# Patient Record
Sex: Female | Born: 1950 | Race: White | Hispanic: No | Marital: Married | Smoking: Former smoker
Health system: Southern US, Community
[De-identification: ages and names within clinical notes are randomized; demographics above are authoritative.]

## PROBLEM LIST (undated history)

## (undated) DIAGNOSIS — Z87442 Personal history of urinary calculi: Secondary | ICD-10-CM

## (undated) DIAGNOSIS — E7800 Pure hypercholesterolemia, unspecified: Secondary | ICD-10-CM

## (undated) DIAGNOSIS — M1990 Unspecified osteoarthritis, unspecified site: Secondary | ICD-10-CM

## (undated) DIAGNOSIS — Z9289 Personal history of other medical treatment: Secondary | ICD-10-CM

## (undated) DIAGNOSIS — T8859XA Other complications of anesthesia, initial encounter: Secondary | ICD-10-CM

## (undated) DIAGNOSIS — R519 Headache, unspecified: Secondary | ICD-10-CM

## (undated) DIAGNOSIS — I82409 Acute embolism and thrombosis of unspecified deep veins of unspecified lower extremity: Secondary | ICD-10-CM

## (undated) DIAGNOSIS — I10 Essential (primary) hypertension: Secondary | ICD-10-CM

## (undated) DIAGNOSIS — J189 Pneumonia, unspecified organism: Secondary | ICD-10-CM

## (undated) DIAGNOSIS — R350 Frequency of micturition: Secondary | ICD-10-CM

## (undated) DIAGNOSIS — J40 Bronchitis, not specified as acute or chronic: Secondary | ICD-10-CM

## (undated) DIAGNOSIS — C801 Malignant (primary) neoplasm, unspecified: Secondary | ICD-10-CM

## (undated) DIAGNOSIS — G5600 Carpal tunnel syndrome, unspecified upper limb: Secondary | ICD-10-CM

## (undated) HISTORY — PX: KIDNEY SURGERY: SHX687

## (undated) HISTORY — PX: TUBAL LIGATION: SHX77

## (undated) HISTORY — PX: APPENDECTOMY: SHX54

---

## 2011-10-17 ENCOUNTER — Emergency Department: Payer: Self-pay | Admitting: Emergency Medicine

## 2011-10-17 LAB — URINALYSIS, COMPLETE
Blood: NEGATIVE
Glucose,UR: 50 mg/dL (ref 0–75)
Nitrite: NEGATIVE
Protein: 100
Specific Gravity: 1.014 (ref 1.003–1.030)
WBC UR: 1 /HPF (ref 0–5)

## 2011-10-17 LAB — CBC
HCT: 44.6 % (ref 35.0–47.0)
HGB: 14.5 g/dL (ref 12.0–16.0)
MCH: 29.4 pg (ref 26.0–34.0)
MCHC: 32.6 g/dL (ref 32.0–36.0)
MCV: 90 fL (ref 80–100)
Platelet: 241 10*3/uL (ref 150–440)

## 2011-10-17 LAB — BASIC METABOLIC PANEL
Anion Gap: 12 (ref 7–16)
BUN: 17 mg/dL (ref 7–18)
Co2: 27 mmol/L (ref 21–32)
Creatinine: 1.17 mg/dL (ref 0.60–1.30)
EGFR (Non-African Amer.): 51 — ABNORMAL LOW

## 2011-10-17 LAB — LIPASE, BLOOD: Lipase: 113 U/L (ref 73–393)

## 2011-11-18 ENCOUNTER — Ambulatory Visit: Payer: Self-pay | Admitting: Urology

## 2011-11-18 LAB — CREATININE, SERUM: EGFR (Non-African Amer.): >60

## 2011-12-19 ENCOUNTER — Other Ambulatory Visit (HOSPITAL_COMMUNITY): Payer: Self-pay | Admitting: Urology

## 2011-12-19 ENCOUNTER — Ambulatory Visit (HOSPITAL_COMMUNITY)
Admission: RE | Admit: 2011-12-19 | Discharge: 2011-12-19 | Disposition: A | Discharge status: Home / Self Care | Payer: BC Managed Care – PPO | Source: Ambulatory Visit | Attending: Urology | Admitting: Urology

## 2011-12-19 DIAGNOSIS — C649 Malignant neoplasm of unspecified kidney, except renal pelvis: Secondary | ICD-10-CM | POA: Insufficient documentation

## 2011-12-19 DIAGNOSIS — Z01811 Encounter for preprocedural respiratory examination: Secondary | ICD-10-CM | POA: Insufficient documentation

## 2011-12-19 DIAGNOSIS — I10 Essential (primary) hypertension: Secondary | ICD-10-CM | POA: Insufficient documentation

## 2011-12-19 DIAGNOSIS — D4959 Neoplasm of unspecified behavior of other genitourinary organ: Secondary | ICD-10-CM

## 2011-12-19 DIAGNOSIS — I517 Cardiomegaly: Secondary | ICD-10-CM | POA: Insufficient documentation

## 2011-12-22 ENCOUNTER — Other Ambulatory Visit: Payer: Self-pay | Admitting: Urology

## 2011-12-24 ENCOUNTER — Encounter (HOSPITAL_COMMUNITY): Payer: Self-pay | Admitting: Pharmacy Technician

## 2011-12-26 ENCOUNTER — Encounter (HOSPITAL_COMMUNITY): Payer: Self-pay

## 2011-12-26 ENCOUNTER — Encounter (HOSPITAL_COMMUNITY)
Admission: RE | Admit: 2011-12-26 | Discharge: 2011-12-26 | Disposition: A | Discharge status: Home / Self Care | Payer: BC Managed Care – PPO | Source: Ambulatory Visit | Attending: Urology | Admitting: Urology

## 2011-12-26 DIAGNOSIS — M1990 Unspecified osteoarthritis, unspecified site: Secondary | ICD-10-CM

## 2011-12-26 DIAGNOSIS — Z9289 Personal history of other medical treatment: Secondary | ICD-10-CM

## 2011-12-26 DIAGNOSIS — I82409 Acute embolism and thrombosis of unspecified deep veins of unspecified lower extremity: Secondary | ICD-10-CM

## 2011-12-26 DIAGNOSIS — R350 Frequency of micturition: Secondary | ICD-10-CM

## 2011-12-26 DIAGNOSIS — E7800 Pure hypercholesterolemia, unspecified: Secondary | ICD-10-CM

## 2011-12-26 DIAGNOSIS — J40 Bronchitis, not specified as acute or chronic: Secondary | ICD-10-CM

## 2011-12-26 DIAGNOSIS — G5600 Carpal tunnel syndrome, unspecified upper limb: Secondary | ICD-10-CM

## 2011-12-26 HISTORY — DX: Acute embolism and thrombosis of unspecified deep veins of unspecified lower extremity: I82.409

## 2011-12-26 HISTORY — DX: Unspecified osteoarthritis, unspecified site: M19.90

## 2011-12-26 HISTORY — PX: KNEE ARTHROSCOPY: SHX127

## 2011-12-26 HISTORY — DX: Frequency of micturition: R35.0

## 2011-12-26 HISTORY — DX: Carpal tunnel syndrome, unspecified upper limb: G56.00

## 2011-12-26 HISTORY — DX: Pure hypercholesterolemia, unspecified: E78.00

## 2011-12-26 HISTORY — PX: TONSILLECTOMY: SUR1361

## 2011-12-26 HISTORY — DX: Personal history of other medical treatment: Z92.89

## 2011-12-26 HISTORY — PX: CHOLECYSTECTOMY: SHX55

## 2011-12-26 HISTORY — PX: FOOT SURGERY: SHX648

## 2011-12-26 HISTORY — PX: JOINT REPLACEMENT: SHX530

## 2011-12-26 HISTORY — DX: Bronchitis, not specified as acute or chronic: J40

## 2011-12-26 HISTORY — PX: ECTOPIC PREGNANCY SURGERY: SHX613

## 2011-12-26 HISTORY — DX: Essential (primary) hypertension: I10

## 2011-12-26 HISTORY — PX: OTHER SURGICAL HISTORY: SHX169

## 2011-12-26 LAB — BASIC METABOLIC PANEL
BUN: 17 mg/dL (ref 6–23)
Chloride: 100 mEq/L (ref 96–112)
GFR calc Af Amer: >90 mL/min (ref >90)
Glucose, Bld: 236 mg/dL — ABNORMAL HIGH (ref 70–99)
Potassium: 3.8 mEq/L (ref 3.5–5.1)

## 2011-12-26 LAB — CBC
HCT: 40.6 % (ref 36.0–46.0)
Hemoglobin: 13.4 g/dL (ref 12.0–15.0)
MCHC: 33 g/dL (ref 30.0–36.0)
RBC: 4.57 MIL/uL (ref 3.87–5.11)

## 2011-12-26 LAB — SURGICAL PCR SCREEN: Staphylococcus aureus: NEGATIVE

## 2011-12-26 NOTE — Patient Instructions (Signed)
20 [name]  12/26/2011   Your procedure is scheduled on:  9-26 -2013  Report to [facility] at      0515  AM ..  Call this number if you have problems the morning of surgery: [phone]  Or Presurgical Testing [phone]([name])   Remember:   Do not eat food:After Midnight.  Follow any bowel prep instructions per Dr. Vevelyn Royals office.    Take these medicines the morning of surgery with A SIP OF WATER: Donot take any Diabetic meds or Blood pressure med Am of surgery. Bring nasal spray to hospital.   Do not wear jewelry, make-up or nail polish.  Do not wear lotions, powders, or perfumes. You may wear deodorant.  Do not shave 48 hours prior to surgery.(face and neck okay, no shaving of legs)  Do not bring valuables to the hospital.  Contacts, dentures or bridgework may not be worn into surgery.  Leave suitcase in the car. After surgery it may be brought to your room.  For patients admitted to the hospital, checkout time is 11:00 AM the day of discharge.   Patients discharged the day of surgery will not be allowed to drive home. Must have responsible person with you x 24 hours once discharged.  Name and phone number of your driver: spouse Grace [name] [phone]  Special Instructions: CHG Shower Use Special Wash: 1/2 bottle night before surgery and 1/2 bottle morning of surgery.(avoid face and genitals)-See special instruction sheet.   Please read over the following fact sheets that you were given: MRSA Information, Blood Transfusion fact sheet, Incentive Spirometry Instruction.

## 2011-12-31 NOTE — H&P (Signed)
Chief Complaint  Right renal neoplasm   Reason For Visit  Reason for consult: To discuss minimally invasive nephron sparing surgery for treatment of her right renal neoplasm. Physician requesting consult: Dr. Irineo Axon PCP: Dr. Luster Landsberg   History of Present Illness  Grace [name] is a 61 year old female with a history of urolithiasis who recently presented with a 4 mm right distal ureteral calculus to Dr. Lonna Cobb.  A CT stone study on 10/17/11 also incidentally demonstrated a possible mass on the upper pole of the right kidney.  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 She has denied any hematuria, weight loss, or abdominal pain symptoms. She has no family history of kidney cancer.  Baseline renal function: Pending  Her medical history is significant for a DVT when she was around 61 years old. This apparently occurred spontaneously and she was treated with anticoagulation. She has not developed recurrent thromboembolic disease and has not had any complications with subsequent surgeries including her bilateral knee replacements. She has undergone a prior cholecystectomy in 1970 through a large right upper quadrant incision. She also had a ruptured tubal pregnancy requiring exploration through a Pfannenstiel incision.   Past Medical History Problems  1. History of  Arthritis V13.4 2. History of  Diabetes Mellitus 250.00 3. Former Smoker V15.82 4. History of  Gastric Ulcer 531.90 5. History of  Hypercholesterolemia 272.0 6. History of  Hypertension 401.9 7. 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 History of  Cholecystectomy 2. History of  Knee Surgery Bilateral 3. History of  Ostectomy Calcaneus Left Foot 4. History of  Surgical Excision Of Tubal Pregnancy 5. History of  Tubal Ligation V25.2  Current Meds 1.  Acetaminophen-Codeine #3 300-30 MG Oral Tablet; Therapy: 28Mar2013 to 2. Aspirin 81 MG Oral Tablet; Therapy: (Recorded:13Sep2013) to 3. Fluticasone Propionate 50 MCG/ACT Nasal Suspension; Therapy: 17Dec2012 to 4. Furosemide 40 MG Oral Tablet; Therapy: 27Aug2012 to 5. GlipiZIDE XL 5 MG Oral Tablet Extended Release 24 Hour; Therapy: 18Mar2013 to 6. Lisinopril 20 MG Oral Tablet; Therapy: 04Apr2013 to 7. Meloxicam 15 MG Oral Tablet; Therapy: 04Apr2013 to 8. MetFORMIN HCl 1000 MG Oral Tablet; Therapy: 05Dec2012 to 9. Potassium Chloride Crys ER 10 MEQ Oral Tablet Extended Release; Therapy:  (Recorded:13Sep2013) to 10. Simvastatin 20 MG Oral Tablet; Therapy: 05Dec2012 to  Allergies Medication  1. Compazine SOLN  Family History Problems  1. Family history of  Arthritis V17.7 2. Paternal history of  Cancer 3. Maternal history of  Cancer 4. Maternal history of  Father Deceased At Age 68 5. Family history of  Heart Disease V17.49 6. Maternal history of  Mother Deceased At Age 66 Denied  80. Family history of  Kidney Cancer  Social History Problems    Marital History - Currently Married Denied    History of  Alcohol Use  Review of Systems Constitutional, skin, eye, otolaryngeal, hematologic/lymphatic, cardiovascular, pulmonary, endocrine, musculoskeletal, gastrointestinal, neurological and psychiatric system(s) were reviewed and pertinent findings if present are noted.  Gastrointestinal: diarrhea.  Constitutional: night sweats and feeling tired (fatigue).  ENT: sinus problems.  Cardiovascular: leg swelling.  Musculoskeletal: back pain and joint pain.    Vitals Vital Signs [Data Includes: Last 1 Day]  13Sep2013 11:02AM  Blood Pressure: 176 / 90 Temperature: 97.9 F Heart Rate: 72  Physical Exam Constitutional: Well nourished and  well developed . No acute distress.  ENT:. The ears and nose are normal in appearance.  Neck: The appearance of the neck is normal and no neck mass is  present.  Pulmonary: No respiratory distress, normal respiratory rhythm and effort and clear bilateral breath sounds.  Cardiovascular: Heart rate and rhythm are normal . No peripheral edema.  Abdomen: right upper quadrant, lower midline, Pfannensteil incision site(s) well healed. The abdomen is obese. The abdomen is soft and nontender. No masses are palpated. No CVA tenderness. No hernias are palpable. No hepatosplenomegaly noted.  Lymphatics: The supraclavicular, femoral and inguinal nodes are not enlarged or tender.  Skin: Normal skin turgor, no visible rash and no visible skin lesions.  Neuro/Psych:. Mood and affect are appropriate.    Results/Data Urine [Data Includes: Last 1 Day]   13Sep2013  COLOR YELLOW   APPEARANCE CLEAR   SPECIFIC GRAVITY 1.015   pH 5.5   GLUCOSE NEG mg/dL  BILIRUBIN NEG   KETONE NEG mg/dL  BLOOD NEG   PROTEIN NEG mg/dL  UROBILINOGEN 0.2 mg/dL  NITRITE NEG   LEUKOCYTE ESTERASE NEG     I independently reviewed her CT scan from [place]. This demonstrates an enhancing 3.2 cm neoplasm which is mostly exophytic extending off the posterior upper pole of the right kidney. There is no associated lymphadenopathy or renal vein/IVC involvement. There do not appear to be any adrenal tumors. There is no other evidence of metastatic disease to the abdomen.   Assessment Assessed  1. Renal Neoplasm 239.5  Plan Health Maintenance (V70.0)  1. UA With REFLEX  Done: 13Sep2013 10:27AM Renal Neoplasm (239.5)  2. CHEST X-RAY  Requested for: 13Sep2013 3. COMPREHENSIVE METABOLIC PANEL  Done: 13Sep2013 4. VENIPUNCTURE  Done: 13Sep2013 5. Follow-up Schedule Surgery Office  Follow-up  Done: 13Sep2013  Discussion/Summary  1. Right renal neoplasm concerning for renal malignancy: She will complete her metastatic evaluation including chest imaging and laboratory studies.   The patient was provided information regarding their renal mass including the relative risk of benign  versus malignant pathology and the natural history of renal cell carcinoma and other possible malignancies of the kidney. The role of renal biopsy, laboratory testing, and imaging studies to further characterize renal masses and/or the presence of metastatic disease were explained. We discussed the role of active surveillance, surgical therapy with both radical nephrectomy and nephron-sparing surgery, and ablative therapy in the treatment of renal masses. In addition, we discussed our goals of providing an accurate diagnosis and oncologic control while maintaining optimal renal function as appropriate based on the size, location, and complexity of their renal mass as well as their co-morbidities.    We have discussed the risks of treatment in detail including but not limited to bleeding, infection, heart attack, stroke, death, venothromoboembolism, cancer recurrence, injury/damage to surrounding organs and structures, urine leak, the possibility of open surgical conversion for patients undergoing minimally invasive surgery, the risk of developing chronic kidney disease and its associated implications, and the potential risk of end stage renal disease possibly necessitating dialysis.   After reviewing options, she does wish to proceed with surgical treatment. I have recommended nephron sparing surgery and will plan to attempt this in a minimally invasive fashion. However, considering her prior extensive surgical history, she understands the distinct risk for open surgical conversion which might be necessary. She will be tentatively scheduled for a right robotic-assisted laparoscopic partial nephrectomy versus possible open right partial nephrectomy. She will be administered a fluoroquinolone antibiotic in addition to routine  skin organism prophylaxis due to her prior total knee replacement and history of diabetes.  Cc: Dr. Irineo Axon Dr. Luster Landsberg    Verified Results COMPREHENSIVE METABOLIC PANEL1 13Sep2013  12:31PM1 Grace [name]  SPECIMEN TYPE: BLOOD  [Dec 20, 2011 7:59AM Grace [name]] Notify patient that labs are ok.   Test Name Result Flag Reference  GLUCOSE1 92 mg/dL1  70-991  BUN1 14 mg/dL1  6-231  CREATININE1 0.81 mg/dL1  0.50-1.501  SODIUM1 139 mEq/L1  [phone]  POTASSIUM1 4.2 mEq/L1  3.5-5.31  CHLORIDE1 103 mEq/L1  96-1121  CO21 29 mEq/L1  19-321  CALCIUM1 9.9 mg/dL1  8.4-10.51  TOTAL PROTEIN1 7.0 g/dL1  6.0-8.31  ALBUMIN1 4.7 g/dL1  3.5-5.21  AST/SGOT1 38 U/L1 H1 0-371  ALT/SGPT1 36 U/L1  0-531  ALKALINE PHOSPHATASE1 61 U/L1  39-1171  BILIRUBIN, TOTAL1 0.5 mg/dL1  0.3-1.21  Est GFR, African American1 >89 mL/min1    Est GFR, NonAfrican American1 >89 mL/min1    The estimated GFR is a calculation valid for adults (58 to 59 years old) that uses the CKD-EPI algorithm to adjust for age and sex. It is not to be used for children, pregnant women, hospitalized patients, patients on dialysis, or with rapidly changing kidney function. According to the NKDEP, eGFR >89 is normal, 60-89 shows mild impairment, 30-59 shows moderate impairment, 15-29 shows severe impairment and <15 is ESRD.     1. Amended By: Heloise Purpura; 12/20/2011 7:59 AMEST  SignaturesElectronically signed by : Heloise Purpura, M.D.; Dec 20 2011  7:59AM

## 2012-01-01 ENCOUNTER — Encounter (HOSPITAL_COMMUNITY): Payer: Self-pay | Admitting: Anesthesiology

## 2012-01-01 ENCOUNTER — Ambulatory Visit (HOSPITAL_COMMUNITY): Payer: BC Managed Care – PPO | Admitting: Anesthesiology

## 2012-01-01 ENCOUNTER — Inpatient Hospital Stay (HOSPITAL_COMMUNITY)
Admission: RE | Admit: 2012-01-01 | Discharge: 2012-01-03 | DRG: 303 | Disposition: A | Discharge status: Home / Self Care | Payer: BC Managed Care – PPO | Source: Ambulatory Visit | Attending: Urology | Admitting: Urology

## 2012-01-01 ENCOUNTER — Encounter (HOSPITAL_COMMUNITY)
Admission: RE | Disposition: A | Discharge status: Home / Self Care | Payer: Self-pay | Source: Ambulatory Visit | Attending: Urology

## 2012-01-01 ENCOUNTER — Encounter (HOSPITAL_COMMUNITY): Payer: Self-pay | Admitting: *Deleted

## 2012-01-01 ENCOUNTER — Other Ambulatory Visit: Payer: Self-pay

## 2012-01-01 DIAGNOSIS — Z87891 Personal history of nicotine dependence: Secondary | ICD-10-CM

## 2012-01-01 DIAGNOSIS — Z86718 Personal history of other venous thrombosis and embolism: Secondary | ICD-10-CM

## 2012-01-01 DIAGNOSIS — Z79899 Other long term (current) drug therapy: Secondary | ICD-10-CM

## 2012-01-01 DIAGNOSIS — Z7982 Long term (current) use of aspirin: Secondary | ICD-10-CM

## 2012-01-01 DIAGNOSIS — N200 Calculus of kidney: Secondary | ICD-10-CM | POA: Diagnosis present

## 2012-01-01 DIAGNOSIS — E119 Type 2 diabetes mellitus without complications: Secondary | ICD-10-CM | POA: Diagnosis present

## 2012-01-01 DIAGNOSIS — I10 Essential (primary) hypertension: Secondary | ICD-10-CM | POA: Diagnosis present

## 2012-01-01 DIAGNOSIS — C649 Malignant neoplasm of unspecified kidney, except renal pelvis: Principal | ICD-10-CM | POA: Diagnosis present

## 2012-01-01 DIAGNOSIS — K769 Liver disease, unspecified: Secondary | ICD-10-CM

## 2012-01-01 DIAGNOSIS — E7800 Pure hypercholesterolemia, unspecified: Secondary | ICD-10-CM | POA: Diagnosis present

## 2012-01-01 LAB — HEMOGLOBIN AND HEMATOCRIT, BLOOD
HCT: 39.7 % (ref 36.0–46.0)
Hemoglobin: 13.7 g/dL (ref 12.0–15.0)

## 2012-01-01 LAB — BASIC METABOLIC PANEL
CO2: 24 mEq/L (ref 19–32)
Chloride: 95 mEq/L — ABNORMAL LOW (ref 96–112)
GFR calc Af Amer: >90 mL/min (ref >90)
Sodium: 130 mEq/L — ABNORMAL LOW (ref 135–145)

## 2012-01-01 LAB — GLUCOSE, CAPILLARY
Glucose-Capillary: 120 mg/dL — ABNORMAL HIGH (ref 70–99)
Glucose-Capillary: 190 mg/dL — ABNORMAL HIGH (ref 70–99)
Glucose-Capillary: 135 mg/dL — ABNORMAL HIGH (ref 70–99)

## 2012-01-01 LAB — ABO/RH: ABO/RH(D): A POS

## 2012-01-01 LAB — TYPE AND SCREEN: Antibody Screen: NEGATIVE

## 2012-01-01 SURGERY — ROBOTIC ASSITED PARTIAL NEPHRECTOMY
Anesthesia: General | Site: Abdomen | Laterality: Right | Wound class: Clean Contaminated

## 2012-01-01 MED ORDER — SODIUM CHLORIDE 0.45 % IV SOLN
INTRAVENOUS | Status: DC
  Administered 2012-01-01 – 2012-01-03 (×5): via INTRAVENOUS

## 2012-01-01 MED ORDER — LACTATED RINGERS IV SOLN
INTRAVENOUS | Status: DC | PRN
  Administered 2012-01-01 (×3): via INTRAVENOUS

## 2012-01-01 MED ORDER — ONDANSETRON HCL 4 MG/2ML IJ SOLN
4.0000 mg | INTRAMUSCULAR | Status: DC | PRN
  Administered 2012-01-01: 4 mg via INTRAVENOUS
  Filled 2012-01-01: qty 2

## 2012-01-01 MED ORDER — HYDROMORPHONE HCL PF 1 MG/ML IJ SOLN
INTRAMUSCULAR | Status: AC
  Filled 2012-01-01: qty 1

## 2012-01-01 MED ORDER — BUPIVACAINE LIPOSOME 1.3 % IJ SUSP
20.0000 mL | Freq: Once | INTRAMUSCULAR | Status: AC
  Administered 2012-01-01: 20 mL
  Filled 2012-01-01: qty 20

## 2012-01-01 MED ORDER — CIPROFLOXACIN IN D5W 400 MG/200ML IV SOLN
INTRAVENOUS | Status: AC
  Filled 2012-01-01: qty 200

## 2012-01-01 MED ORDER — DIPHENHYDRAMINE HCL 12.5 MG/5ML PO ELIX: 12.5000 mg | ORAL_SOLUTION | Freq: Four times a day (QID) | ORAL | Status: DC | PRN

## 2012-01-01 MED ORDER — CIPROFLOXACIN IN D5W 400 MG/200ML IV SOLN
400.0000 mg | INTRAVENOUS | Status: AC
  Administered 2012-01-01: 400 mg via INTRAVENOUS

## 2012-01-01 MED ORDER — BUPIVACAINE-EPINEPHRINE PF 0.25-1:200000 % IJ SOLN
INTRAMUSCULAR | Status: AC
  Filled 2012-01-01: qty 30

## 2012-01-01 MED ORDER — MANNITOL 25 % IV SOLN
INTRAVENOUS | Status: DC | PRN
  Administered 2012-01-01 (×2): 12.5 g via INTRAVENOUS

## 2012-01-01 MED ORDER — FLUTICASONE PROPIONATE 50 MCG/ACT NA SUSP
2.0000 | Freq: Every day | NASAL | Status: DC
  Administered 2012-01-01 – 2012-01-03 (×3): 2 via NASAL
  Filled 2012-01-01: qty 16

## 2012-01-01 MED ORDER — MANNITOL 25 % IV SOLN
25.0000 g | Freq: Once | INTRAVENOUS | Status: DC
  Filled 2012-01-01: qty 100

## 2012-01-01 MED ORDER — GLYCOPYRROLATE 0.2 MG/ML IJ SOLN
INTRAMUSCULAR | Status: DC | PRN
  Administered 2012-01-01: .5 mg via INTRAVENOUS

## 2012-01-01 MED ORDER — INSULIN ASPART 100 UNIT/ML ~~LOC~~ SOLN
SUBCUTANEOUS | Status: AC
  Filled 2012-01-01: qty 1

## 2012-01-01 MED ORDER — PROPOFOL 10 MG/ML IV BOLUS
INTRAVENOUS | Status: DC | PRN
  Administered 2012-01-01: 150 mg via INTRAVENOUS

## 2012-01-01 MED ORDER — ONDANSETRON HCL 4 MG/2ML IJ SOLN
INTRAMUSCULAR | Status: DC | PRN
  Administered 2012-01-01: 4 mg via INTRAVENOUS

## 2012-01-01 MED ORDER — CEFAZOLIN SODIUM 1-5 GM-% IV SOLN
1.0000 g | Freq: Three times a day (TID) | INTRAVENOUS | Status: AC
  Administered 2012-01-01 (×2): 1 g via INTRAVENOUS
  Filled 2012-01-01 (×2): qty 50

## 2012-01-01 MED ORDER — ACETAMINOPHEN 10 MG/ML IV SOLN
1000.0000 mg | Freq: Four times a day (QID) | INTRAVENOUS | Status: AC
  Administered 2012-01-01 – 2012-01-02 (×4): 1000 mg via INTRAVENOUS
  Filled 2012-01-01 (×4): qty 100

## 2012-01-01 MED ORDER — LACTATED RINGERS IR SOLN
Status: DC | PRN
  Administered 2012-01-01: 1000 mL

## 2012-01-01 MED ORDER — SUCCINYLCHOLINE CHLORIDE 20 MG/ML IJ SOLN
INTRAMUSCULAR | Status: DC | PRN
  Administered 2012-01-01: 100 mg via INTRAVENOUS

## 2012-01-01 MED ORDER — ACETAMINOPHEN 10 MG/ML IV SOLN
INTRAVENOUS | Status: AC
  Filled 2012-01-01: qty 100

## 2012-01-01 MED ORDER — INSULIN ASPART 100 UNIT/ML ~~LOC~~ SOLN
0.0000 [IU] | SUBCUTANEOUS | Status: DC
  Administered 2012-01-01: 3 [IU] via SUBCUTANEOUS
  Administered 2012-01-01: 8 [IU] via SUBCUTANEOUS
  Administered 2012-01-01 (×2): 2 [IU] via SUBCUTANEOUS
  Administered 2012-01-02 (×3): 3 [IU] via SUBCUTANEOUS
  Administered 2012-01-02: 2 [IU] via SUBCUTANEOUS
  Administered 2012-01-02 – 2012-01-03 (×3): 3 [IU] via SUBCUTANEOUS
  Administered 2012-01-03 (×2): 2 [IU] via SUBCUTANEOUS

## 2012-01-01 MED ORDER — PROMETHAZINE HCL 25 MG/ML IJ SOLN: 6.2500 mg | INTRAMUSCULAR | Status: DC | PRN

## 2012-01-01 MED ORDER — MORPHINE SULFATE 2 MG/ML IJ SOLN
2.0000 mg | INTRAMUSCULAR | Status: DC | PRN
  Administered 2012-01-01 – 2012-01-02 (×3): 2 mg via INTRAVENOUS
  Administered 2012-01-02: 4 mg via INTRAVENOUS
  Filled 2012-01-01: qty 2
  Filled 2012-01-01 (×3): qty 1

## 2012-01-01 MED ORDER — SIMVASTATIN 20 MG PO TABS
20.0000 mg | ORAL_TABLET | Freq: Every day | ORAL | Status: DC
Start: 2012-01-01 — End: 2012-01-03
  Administered 2012-01-01 – 2012-01-02 (×2): 20 mg via ORAL
  Filled 2012-01-01 (×3): qty 1

## 2012-01-01 MED ORDER — HYDROMORPHONE HCL PF 1 MG/ML IJ SOLN
0.2500 mg | INTRAMUSCULAR | Status: DC | PRN
  Administered 2012-01-01 (×4): 0.5 mg via INTRAVENOUS

## 2012-01-01 MED ORDER — NEOSTIGMINE METHYLSULFATE 1 MG/ML IJ SOLN
INTRAMUSCULAR | Status: DC | PRN
  Administered 2012-01-01: 4 mg via INTRAVENOUS

## 2012-01-01 MED ORDER — ZOLPIDEM TARTRATE 5 MG PO TABS: 5.0000 mg | ORAL_TABLET | Freq: Every evening | ORAL | Status: DC | PRN

## 2012-01-01 MED ORDER — CISATRACURIUM BESYLATE (PF) 10 MG/5ML IV SOLN
INTRAVENOUS | Status: DC | PRN
  Administered 2012-01-01: 6 mg via INTRAVENOUS
  Administered 2012-01-01: 8 mg via INTRAVENOUS
  Administered 2012-01-01: 4 mg via INTRAVENOUS
  Administered 2012-01-01: 8 mg via INTRAVENOUS

## 2012-01-01 MED ORDER — BUPIVACAINE-EPINEPHRINE 0.25% -1:200000 IJ SOLN
INTRAMUSCULAR | Status: DC | PRN
  Administered 2012-01-01: 7 mL

## 2012-01-01 MED ORDER — HEMOSTATIC AGENTS (NO CHARGE) OPTIME
TOPICAL | Status: DC | PRN
Start: 2012-01-01 — End: 2012-01-01
  Administered 2012-01-01: 1 via TOPICAL

## 2012-01-01 MED ORDER — HYDROMORPHONE HCL PF 1 MG/ML IJ SOLN
0.2500 mg | INTRAMUSCULAR | Status: DC | PRN
  Administered 2012-01-01: 0.25 mg via INTRAVENOUS

## 2012-01-01 MED ORDER — CEFAZOLIN SODIUM-DEXTROSE 2-3 GM-% IV SOLR
INTRAVENOUS | Status: AC
  Filled 2012-01-01: qty 50

## 2012-01-01 MED ORDER — FUROSEMIDE 40 MG PO TABS
40.0000 mg | ORAL_TABLET | Freq: Every morning | ORAL | Status: DC
  Administered 2012-01-01 – 2012-01-03 (×3): 40 mg via ORAL
  Filled 2012-01-01 (×3): qty 1

## 2012-01-01 MED ORDER — CEFAZOLIN SODIUM 10 G IJ SOLR
3.0000 g | INTRAMUSCULAR | Status: AC
  Administered 2012-01-01: 3 g via INTRAVENOUS

## 2012-01-01 MED ORDER — DIPHENHYDRAMINE HCL 50 MG/ML IJ SOLN: 12.5000 mg | Freq: Four times a day (QID) | INTRAMUSCULAR | Status: DC | PRN

## 2012-01-01 MED ORDER — HYDRALAZINE HCL 20 MG/ML IJ SOLN
INTRAMUSCULAR | Status: DC | PRN
  Administered 2012-01-01 (×3): 5 mg via INTRAVENOUS

## 2012-01-01 MED ORDER — LISINOPRIL 40 MG PO TABS
60.0000 mg | ORAL_TABLET | Freq: Every day | ORAL | Status: DC
  Administered 2012-01-01 – 2012-01-03 (×3): 60 mg via ORAL
  Filled 2012-01-01 (×3): qty 1

## 2012-01-01 MED ORDER — DOCUSATE SODIUM 100 MG PO CAPS
100.0000 mg | ORAL_CAPSULE | Freq: Two times a day (BID) | ORAL | Status: DC
  Administered 2012-01-01 – 2012-01-03 (×5): 100 mg via ORAL
  Filled 2012-01-01 (×6): qty 1

## 2012-01-01 MED ORDER — LACTATED RINGERS IV SOLN: INTRAVENOUS | Status: DC

## 2012-01-01 MED ORDER — CEFAZOLIN SODIUM 1-5 GM-% IV SOLN
INTRAVENOUS | Status: AC
  Filled 2012-01-01: qty 50

## 2012-01-01 MED ORDER — MIDAZOLAM HCL 5 MG/5ML IJ SOLN
INTRAMUSCULAR | Status: DC | PRN
  Administered 2012-01-01: 2 mg via INTRAVENOUS

## 2012-01-01 MED ORDER — FENTANYL CITRATE 0.05 MG/ML IJ SOLN
INTRAMUSCULAR | Status: DC | PRN
  Administered 2012-01-01 (×7): 50 ug via INTRAVENOUS
  Administered 2012-01-01: 100 ug via INTRAVENOUS
  Administered 2012-01-01: 50 ug via INTRAVENOUS

## 2012-01-01 MED ORDER — ACETAMINOPHEN 10 MG/ML IV SOLN
INTRAVENOUS | Status: DC | PRN
  Administered 2012-01-01: 1000 mg via INTRAVENOUS

## 2012-01-01 MED ORDER — INDOCYANINE GREEN 25 MG IV SOLR
INTRAVENOUS | Status: DC | PRN
  Administered 2012-01-01: 7.5 mg via INTRAVENOUS

## 2012-01-01 SURGICAL SUPPLY — 65 items
APPLICATOR SURGIFLO ENDO (HEMOSTASIS) IMPLANT
CANNULA SEAL DVNC (CANNULA) ×3 IMPLANT
CANNULA SEALS DA VINCI (CANNULA) ×3
CHLORAPREP W/TINT 26ML (MISCELLANEOUS) ×4 IMPLANT
CLIP LIGATING HEM O LOK PURPLE (MISCELLANEOUS) ×2 IMPLANT
CLIP LIGATING HEMO O LOK GREEN (MISCELLANEOUS) ×4 IMPLANT
CLOTH BEACON ORANGE TIMEOUT ST (SAFETY) ×2 IMPLANT
CORD HIGH FREQUENCY UNIPOLAR (ELECTROSURGICAL) ×2 IMPLANT
CORDS BIPOLAR (ELECTRODE) ×2 IMPLANT
COVER SURGICAL LIGHT HANDLE (MISCELLANEOUS) ×2 IMPLANT
COVER TIP SHEARS 8 DVNC (MISCELLANEOUS) ×1 IMPLANT
COVER TIP SHEARS 8MM DA VINCI (MISCELLANEOUS) ×1
DECANTER SPIKE VIAL GLASS SM (MISCELLANEOUS) ×2 IMPLANT
DERMABOND ADVANCED (GAUZE/BANDAGES/DRESSINGS) ×3
DERMABOND ADVANCED .7 DNX12 (GAUZE/BANDAGES/DRESSINGS) ×3 IMPLANT
DRAIN CHANNEL 15F RND FF 3/16 (WOUND CARE) ×2 IMPLANT
DRAPE INCISE IOBAN 66X45 STRL (DRAPES) ×2 IMPLANT
DRAPE LAPAROSCOPIC ABDOMINAL (DRAPES) ×2 IMPLANT
DRAPE LG THREE QUARTER DISP (DRAPES) ×4 IMPLANT
DRAPE TABLE BACK 44X90 PK DISP (DRAPES) ×2 IMPLANT
DRAPE WARM FLUID 44X44 (DRAPE) ×2 IMPLANT
DRESSING SURGICEL FIBRLLR 1X2 (HEMOSTASIS) ×1 IMPLANT
DRSG SURGICEL FIBRILLAR 1X2 (HEMOSTASIS) ×2
ELECT REM PT RETURN 9FT ADLT (ELECTROSURGICAL) ×4
ELECTRODE REM PT RTRN 9FT ADLT (ELECTROSURGICAL) ×2 IMPLANT
EVACUATOR SILICONE 100CC (DRAIN) ×2 IMPLANT
GAUZE VASELINE 3X9 (GAUZE/BANDAGES/DRESSINGS) IMPLANT
GLOVE BIO SURGEON STRL SZ 6.5 (GLOVE) ×2 IMPLANT
GLOVE BIOGEL M STRL SZ7.5 (GLOVE) ×4 IMPLANT
GOWN STRL NON-REIN LRG LVL3 (GOWN DISPOSABLE) ×6 IMPLANT
GOWN STRL REIN XL XLG (GOWN DISPOSABLE) ×2 IMPLANT
HEMOSTAT SURGICEL 4X8 (HEMOSTASIS) ×2 IMPLANT
KIT ACCESSORY DA VINCI DISP (KITS) ×1
KIT ACCESSORY DVNC DISP (KITS) ×1 IMPLANT
KIT BASIN OR (CUSTOM PROCEDURE TRAY) ×2 IMPLANT
KIT PROCEDURE DA VINCI SI (MISCELLANEOUS) ×1
KIT PROCEDURE DVNC SI (MISCELLANEOUS) ×1 IMPLANT
NS IRRIG 1000ML POUR BTL (IV SOLUTION) ×4 IMPLANT
PENCIL BUTTON HOLSTER BLD 10FT (ELECTRODE) ×2 IMPLANT
POSITIONER SURGICAL ARM (MISCELLANEOUS) ×4 IMPLANT
POUCH SPECIMEN RETRIEVAL 10MM (ENDOMECHANICALS) ×2 IMPLANT
SET TUBE IRRIG SUCTION NO TIP (IRRIGATION / IRRIGATOR) ×2 IMPLANT
SOLUTION ANTI FOG 6CC (MISCELLANEOUS) ×2 IMPLANT
SOLUTION ELECTROLUBE (MISCELLANEOUS) ×2 IMPLANT
SPONGE LAP 18X18 X RAY DECT (DISPOSABLE) IMPLANT
SURGIFLO W/THROMBIN 8M KIT (HEMOSTASIS) ×2 IMPLANT
SUT ETHILON 3 0 PS 1 (SUTURE) ×2 IMPLANT
SUT MNCRL AB 4-0 PS2 18 (SUTURE) ×4 IMPLANT
SUT V-LOC BARB 180 2/0GR6 GS22 (SUTURE) ×4
SUT V-LOC BARB 180 2/0GR9 GS23 (SUTURE)
SUT VIC AB 0 CT1 27 (SUTURE) ×1
SUT VIC AB 0 CT1 27XBRD ANTBC (SUTURE) ×1 IMPLANT
SUT VICRYL 0 UR6 27IN ABS (SUTURE) ×4 IMPLANT
SUT VLOC BARB 180 ABS3/0GR12 (SUTURE) ×2
SUTURE V-LC BRB 180 2/0GR6GS22 (SUTURE) ×2 IMPLANT
SUTURE V-LC BRB 180 2/0GR9GS23 (SUTURE) IMPLANT
SUTURE VLOC BRB 180 ABS3/0GR12 (SUTURE) ×1 IMPLANT
SYR BULB IRRIGATION 50ML (SYRINGE) IMPLANT
TOWEL OR NON WOVEN STRL DISP B (DISPOSABLE) ×2 IMPLANT
TRAY FOLEY CATH 14FRSI W/METER (CATHETERS) ×2 IMPLANT
TRAY LAP CHOLE (CUSTOM PROCEDURE TRAY) ×2 IMPLANT
TROCAR ENDOPATH XCEL 12X100 BL (ENDOMECHANICALS) ×2 IMPLANT
TROCAR XCEL 12X100 BLDLESS (ENDOMECHANICALS) ×2 IMPLANT
TUBING INSUFFLATION 10FT LAP (TUBING) ×2 IMPLANT
WATER STERILE IRR 1500ML POUR (IV SOLUTION) ×4 IMPLANT

## 2012-01-01 NOTE — Transfer of Care (Signed)
Immediate Anesthesia Transfer of Care Note  Patient: Grace [name]  Procedure(s) Performed: Procedure(s) (LRB) with comments: ROBOTIC ASSITED PARTIAL NEPHRECTOMY (Right) - POSSIBLE OPEN NEPHRECTOMY      Patient Location: PACU  Anesthesia Type: General  Level of Consciousness: patient cooperative  Airway & Oxygen Therapy: Patient Spontanous Breathing and Patient connected to face mask oxygen  Post-op Assessment: Report given to PACU RN and Post -op Vital signs reviewed and stable  Post vital signs: Reviewed and stable  Complications: No apparent anesthesia complications

## 2012-01-01 NOTE — Preoperative (Signed)
Beta Blockers   Reason not to administer Beta Blockers:Not Applicable 

## 2012-01-01 NOTE — Anesthesia Preprocedure Evaluation (Signed)
Anesthesia Evaluation  Patient identified by MRN, date of birth, ID band Patient awake    Reviewed: Allergy & Precautions, H&P , NPO status , Patient's Chart, lab work & pertinent test results  History of Anesthesia Complications Negative for: history of anesthetic complications  Airway Mallampati: II TM Distance: >3 FB Neck ROM: Full    Dental  (+) Dental Advisory Given and Partial Upper   Pulmonary neg pulmonary ROS,  breath sounds clear to auscultation  Pulmonary exam normal       Cardiovascular hypertension, Rhythm:Regular Rate:Normal     Neuro/Psych  Neuromuscular disease negative neurological ROS  negative psych ROS   GI/Hepatic negative GI ROS, Neg liver ROS,   Endo/Other  diabetes, Type 2, Oral Hypoglycemic AgentsMorbid obesity  Renal/GU negative Renal ROS  negative genitourinary   Musculoskeletal negative musculoskeletal ROS (+)   Abdominal   Peds  Hematology negative hematology ROS (+)   Anesthesia Other Findings   Reproductive/Obstetrics negative OB ROS                           Anesthesia Physical Anesthesia Plan  ASA: III  Anesthesia Plan: General   Post-op Pain Management:    Induction: Intravenous  Airway Management Planned: Oral ETT  Additional Equipment:   Intra-op Plan:   Post-operative Plan: Extubation in OR  Informed Consent: I have reviewed the patients History and Physical, chart, labs and discussed the procedure including the risks, benefits and alternatives for the proposed anesthesia with the patient or authorized representative who has indicated his/her understanding and acceptance.   Dental advisory given  Plan Discussed with: CRNA  Anesthesia Plan Comments:         Anesthesia Quick Evaluation

## 2012-01-01 NOTE — Anesthesia Postprocedure Evaluation (Signed)
Anesthesia Post Note  Patient: Grace [name]  Procedure(s) Performed: Procedure(s) (LRB): ROBOTIC ASSITED PARTIAL NEPHRECTOMY (Right)  Anesthesia type: General  Patient location: PACU  Post pain: Pain level controlled  Post assessment: Post-op Vital signs reviewed  Last Vitals:  Filed Vitals:   01/01/12 1145  BP: 164/80  Pulse:   Temp:   Resp:     Post vital signs: Reviewed  Level of consciousness: sedated  Complications: No apparent anesthesia complications

## 2012-01-01 NOTE — Interval H&P Note (Signed)
History and Physical Interval Note:  01/01/2012 7:21 AM  Grace [name]  has presented today for surgery, with the diagnosis of RIGHT RENAL NEOPLASM  The various methods of treatment have been discussed with the patient and family. After consideration of risks, benefits and other options for treatment, the patient has consented to  Procedure(s) (LRB) with comments: ROBOTIC ASSITED PARTIAL NEPHRECTOMY (Right) - POSSIBLE OPEN NEPHRECTOMY     as a surgical intervention .  The patient's history has been reviewed, patient examined, no change in status, stable for surgery.  I have reviewed the patient's chart and labs.  Questions were answered to the patient's satisfaction.     [name],LES

## 2012-01-01 NOTE — Progress Notes (Signed)
Patient ID: Grace [name], female   DOB: 1950-07-10, 61 y.o.   MRN: [id number]  Post-op note  Subjective: The patient is doing well.  No complaints.  Objective: Vital signs in last 24 hours: Temp:  [97 F (36.1 C)-98.1 F (36.7 C)] 97.7 F (36.5 C) (09/26 1232) Pulse Rate:  [71-86] 86  (09/26 1232) Resp:  [13-20] 18  (09/26 1232) BP: (138-181)/(59-96) 138/59 mmHg (09/26 1232) SpO2:  [94 %-99 %] 94 % (09/26 1232) Weight:  [133.3 kg (293 lb 14 oz)] 133.3 kg (293 lb 14 oz) (09/26 1200)  Intake/Output from previous day:   Intake/Output this shift: Total I/O In: 2700 [I.V.:2700] Out: 760 [Urine:400; Drains:60; Blood:300]  Physical Exam:  General: Alert and oriented. Abdomen: Soft, Nondistended. Incisions: Clean and dry.  Lab Results:  Basename 01/01/12 1130  HGB 13.7  HCT 39.7    Assessment/Plan: POD#0   1) Continue to monitor   Moody Bruins. MD   LOS: 0 days   Sharnice Bosler,LES 01/01/2012, 5:37 PM

## 2012-01-01 NOTE — Op Note (Signed)
Preoperative diagnosis: Right renal neoplasm  Postoperative diagnosis: RIght renal neoplasm  Procedure:  1. Right robotic-assisted laparoscopic partial nephrectomy  Surgeon: Moody Bruins. M.D.  Assistant(s): Pecola Leisure, PA-C  Anesthesia: General  Complications: None  EBL: 300 mL  IVF:  2100 mL crystalloid  Specimens: 1. Right renal neoplasm  Disposition of specimens: Pathology  Intraoperative findings:       1. Segmental warm renal ischemia time: 20 minutes  Drains: 1. # 15 Blake perinephric drain  Indication:  Grace [name] is a 61 y.o. year old patient with a right renal neoplasm.  After a thorough review of the management options for their renal mass, they elected to proceed with surgical treatment and the above procedure.  We have discussed the potential benefits and risks of the procedure, side effects of the proposed treatment, the likelihood of the patient achieving the goals of the procedure, and any potential problems that might occur during the procedure or recuperation. Informed consent has been obtained.   Description of procedure:  The patient was taken to the operating room and a general anesthetic was administered. The patient was given preoperative antibiotics, placed in the right modified flank position with care to pad all potential pressure points, and prepped and draped in the usual sterile fashion. Next a preoperative timeout was performed.  A site was selected on the right side of the umbilicus for placement of the camera port. Due to her obesity, this site was somewhat lateral compared to usual placement.  It was well away from her prior right subcostal incision. This was placed using a standard open Hassan technique which allowed entry into the peritoneal cavity under direct vision and without difficulty. No adhesions were palpable around this incision site. A 12 mm port was placed and a pneumoperitoneum established. The camera was then  used to inspect the abdomen and there was no evidence of any intra-abdominal injuries. There were extensive adhesions in the right upper quadrant which were away from the areas that the other ports were to be placed. The remaining abdominal ports were then placed. 8 mm robotic ports were placed in the right upper quadrant, right lower quadrant, and far right lateral abdominal wall. A 12 mm port was placed in the upper midline for laparoscopic assistance. All ports were placed under direct vision without difficulty. The surgical cart was then docked.   Utilizing the cautery scissors, the adhesions were carefully taken down allowing the liver to be mobilized superiorly. The white line of Toldt was incised allowing the colon to be mobilized medially and the plane between the mesocolon and the anterior layer of Gerota's fascia to be developed and the kidney to be exposed.  The ureter and gonadal vein were identified inferiorly and the ureter was lifted anteriorly off the psoas muscle.  Dissection proceeded superiorly along the gonadal vein until the renal vein was identified.  The renal hilum was then carefully isolated with a combination of blunt and sharp dissection allowing the renal arterial and venous structures to be separated and isolated in preparation for renal hilar vessel clamping. There were two separate renal veins with a single branching renal artery posterior to the upper vein.  12.5 g of IV mannitol was then administered.   Attention turned to the kidney and the perinephric fat surrounding the renal mass was removed and the kidney was mobilized sufficiently for exposure and resection of the renal mass.   Once the renal mass was properly isolated, preparations were made for resection  of the tumor.  Reconstructive sutures were placed into the abdomen for the renorrhaphy portion of the procedure.  The superior pole renal artery was then clamped with bulldog clamps for segmental renal ischemia.  Indocyanine green dye was administered which confirmed ischemia around the upper pole area where the tumor was located.  The tumor was then excised with cold scissor dissection along with an adequate visible gross margin of normal renal parenchyma. The tumor appeared to be excised without any gross violation of the tumor. The renal collecting system was not entered during removal of the tumor.  A running 3-0 V-lock suture was then brought through the capsule of the kidney and run along the base of the renal defect to provide hemostasis and close any entry into the renal collecting system if present. Weck clips were used to secure this suture outside the renal capsule at the proximal and distal ends. Floseal was then placed into the renal defect and the renal parenchyma was closed with a 2-0 V-lock horizontal mattress capsular suture which resulted in excellent compression of the renal defect.    The bulldog clamps were then removed from the upper pole renal artery and an additional 12.5 g of IV mannitol was administered. Total segmental warm renal ischemia time was 20 minutes. The renal tumor resection site was examined. Hemostasis appeared adequate.   The kidney was placed back into its normal anatomic position and covered with perinephric fat as needed.  A # 15 Blake drain was then brought through the lateral lower port site and positioned in the perinephric space.  It was secured to the skin with a nylon suture. The surgical cart was undocked.  The renal tumor specimen was removed intact within an endopouch retrieval bag via the camera port sites.  The camera port site and the other 12 mm port site were then closed at the fascial level with 0-vicryl suture.  All other laparoscopic/robotic ports were removed under direct vision and the pneumoperitoneum let down with inspection of the operative field performed and hemostasis again confirmed. All incision sites were then injected with local anesthetic and  reapproximated at the skin level with 4-0 monocryl subcuticular closures.  Dermabond was applied to the skin.  The patient tolerated the procedure well and without complications.  The patient was able to be extubated and transferred to the recovery unit in satisfactory condition.  Moody Bruins MD

## 2012-01-02 LAB — GLUCOSE, CAPILLARY
Glucose-Capillary: 173 mg/dL — ABNORMAL HIGH (ref 70–99)
Glucose-Capillary: 162 mg/dL — ABNORMAL HIGH (ref 70–99)
Glucose-Capillary: 186 mg/dL — ABNORMAL HIGH (ref 70–99)

## 2012-01-02 LAB — BASIC METABOLIC PANEL
CO2: 29 mEq/L (ref 19–32)
Chloride: 98 mEq/L (ref 96–112)
GFR calc non Af Amer: 60 mL/min — ABNORMAL LOW (ref >90)
Glucose, Bld: 148 mg/dL — ABNORMAL HIGH (ref 70–99)
Potassium: 4 mEq/L (ref 3.5–5.1)
Sodium: 136 mEq/L (ref 135–145)

## 2012-01-02 LAB — HEMOGLOBIN AND HEMATOCRIT, BLOOD: HCT: 37.6 % (ref 36.0–46.0)

## 2012-01-02 MED ORDER — HYDROCODONE-ACETAMINOPHEN 5-325 MG PO TABS: 1.0000 | ORAL_TABLET | Freq: Four times a day (QID) | ORAL | Status: DC | PRN

## 2012-01-02 MED ORDER — BISACODYL 10 MG RE SUPP
10.0000 mg | Freq: Once | RECTAL | Status: AC
  Administered 2012-01-02: 10 mg via RECTAL
  Filled 2012-01-02: qty 1

## 2012-01-02 MED ORDER — DOCUSATE SODIUM 100 MG PO CAPS: 100.0000 mg | ORAL_CAPSULE | Freq: Two times a day (BID) | ORAL | Status: DC

## 2012-01-02 MED ORDER — DSS 100 MG PO CAPS: 100.0000 mg | ORAL_CAPSULE | Freq: Two times a day (BID) | ORAL | Status: DC

## 2012-01-02 MED ORDER — HYDROCODONE-ACETAMINOPHEN 5-325 MG PO TABS
1.0000 | ORAL_TABLET | Freq: Four times a day (QID) | ORAL | Status: DC | PRN
  Administered 2012-01-02 – 2012-01-03 (×5): 2 via ORAL
  Filled 2012-01-02 (×5): qty 2

## 2012-01-02 NOTE — Progress Notes (Signed)
   CARE MANAGEMENT NOTE 01/02/2012  Patient:  Grace [name], Grace [name]   Account Number:  [id number]  Date Initiated:  01/02/2012  Documentation initiated by:  Jiles Crocker  Subjective/Objective Assessment:   ADMITTED FOR SURGERY Right robotic-assisted laparoscopic partial nephrectomy     Action/Plan:   PCP: Dr. Allison Quarry AT HOME WITH SPOUSE   Anticipated DC Date:  01/03/2012   Anticipated DC Plan:  HOME/SELF CARE      DC Planning Services  CM consult          Status of service:  In process, will continue to follow Medicare Important Message given?  NA - LOS <3 / Initial given by admissions (If response is "NO", the following Medicare IM given date fields will be blank)  Per UR Regulation:  Reviewed for med. necessity/level of care/duration of stay  Comments:  01/02/2012- B Lavaya Defreitas RN, BSN, MHA

## 2012-01-02 NOTE — Progress Notes (Addendum)
Patient ambulated in hallway on room air. When patient returned to room the O2 Sats were 88-91%. The patient was placed back on oxygen for now at 2 L nasal canula.

## 2012-01-02 NOTE — Progress Notes (Signed)
Patient ID: Grace [name], female   DOB: May 08, 1950, 61 y.o.   MRN: [id number]  1 Day Post-Op Subjective: The patient is doing well.  No nausea or vomiting. Pain at incision sites but adequately controlled.  Objective: Vital signs in last 24 hours: Temp:  [97 F (36.1 C)-99.1 F (37.3 C)] 98.2 F (36.8 C) (09/27 0551) Pulse Rate:  [71-86] 81  (09/27 0551) Resp:  [13-18] 18  (09/27 0551) BP: (125-171)/(55-88) 133/55 mmHg (09/27 0551) SpO2:  [94 %-99 %] 96 % (09/27 0551) Weight:  [133.3 kg (293 lb 14 oz)] 133.3 kg (293 lb 14 oz) (09/26 1200)  Intake/Output from previous day: 09/26 0701 - 09/27 0700 In: 5750 [I.V.:5450; IV Piggyback:300] Out: 1985 [Urine:1500; Drains:185; Blood:300] Intake/Output this shift:    Physical Exam:  General: Alert and oriented. CV: RRR Lungs: Clear bilaterally. GI: Soft, Nondistended. Incisions: Clean and dry. Urine: Clear Extremities: Nontender, no erythema, no edema.  Lab Results:  Basename 01/02/12 0458 01/01/12 1130  HGB 12.4 13.7  HCT 37.6 39.7          Basename 01/02/12 0458 01/01/12 1130 12/26/11 1355  CREATININE 1.00 0.76 0.76           Results for orders placed during the hospital encounter of 01/01/12 (from the past 24 hour(s))  GLUCOSE, CAPILLARY     Status: Abnormal   Collection Time   01/01/12 11:12 AM      Component Value Range   Glucose-Capillary 293 (*) 70 - 99 mg/dL  BASIC METABOLIC PANEL     Status: Abnormal   Collection Time   01/01/12 11:30 AM      Component Value Range   Sodium 130 (*) 135 - 145 mEq/L   Potassium 3.9  3.5 - 5.1 mEq/L   Chloride 95 (*) 96 - 112 mEq/L   CO2 24  19 - 32 mEq/L   Glucose, Bld 287 (*) 70 - 99 mg/dL   BUN 13  6 - 23 mg/dL   Creatinine, Ser 4.09  0.50 - 1.10 mg/dL   Calcium 8.7  8.4 - 81.1 mg/dL   GFR calc non Af Amer >90  >90 mL/min   GFR calc Af Amer >90  >90 mL/min  HEMOGLOBIN AND HEMATOCRIT, BLOOD     Status: Normal   Collection Time   01/01/12 11:30 AM      Component Value  Range   Hemoglobin 13.7  12.0 - 15.0 g/dL   HCT 91.4  78.2 - 95.6 %  GLUCOSE, CAPILLARY     Status: Abnormal   Collection Time   01/01/12  4:50 PM      Component Value Range   Glucose-Capillary 190 (*) 70 - 99 mg/dL  GLUCOSE, CAPILLARY     Status: Abnormal   Collection Time   01/01/12  8:00 PM      Component Value Range   Glucose-Capillary 135 (*) 70 - 99 mg/dL  GLUCOSE, CAPILLARY     Status: Abnormal   Collection Time   01/01/12 11:54 PM      Component Value Range   Glucose-Capillary 128 (*) 70 - 99 mg/dL   Comment 1 Notify RN    GLUCOSE, CAPILLARY     Status: Abnormal   Collection Time   01/02/12  4:01 AM      Component Value Range   Glucose-Capillary 139 (*) 70 - 99 mg/dL  BASIC METABOLIC PANEL     Status: Abnormal   Collection Time   01/02/12  4:58 AM  Component Value Range   Sodium 136  135 - 145 mEq/L   Potassium 4.0  3.5 - 5.1 mEq/L   Chloride 98  96 - 112 mEq/L   CO2 29  19 - 32 mEq/L   Glucose, Bld 148 (*) 70 - 99 mg/dL   BUN 11  6 - 23 mg/dL   Creatinine, Ser 6.29  0.50 - 1.10 mg/dL   Calcium 8.9  8.4 - 52.8 mg/dL   GFR calc non Af Amer 60 (*) >90 mL/min   GFR calc Af Amer 70 (*) >90 mL/min  HEMOGLOBIN AND HEMATOCRIT, BLOOD     Status: Normal   Collection Time   01/02/12  4:58 AM      Component Value Range   Hemoglobin 12.4  12.0 - 15.0 g/dL   HCT 41.3  24.4 - 01.0 %    Assessment/Plan: POD# 1 s/p robotic partial nephrectomy.  1) Ambulate, Incentive spirometry 2) Advance diet as tolerated 3) Transition to oral pain medication 4) Dulcolax suppository 5) D/C urethral catheter   Moody Bruins. MD   LOS: 1 day   Issaiah Seabrooks,LES 01/02/2012, 7:18 AM

## 2012-01-03 LAB — BASIC METABOLIC PANEL
CO2: 32 mEq/L (ref 19–32)
Calcium: 8.7 mg/dL (ref 8.4–10.5)
Creatinine, Ser: 0.84 mg/dL (ref 0.50–1.10)
GFR calc non Af Amer: 75 mL/min — ABNORMAL LOW (ref >90)
Glucose, Bld: 180 mg/dL — ABNORMAL HIGH (ref 70–99)

## 2012-01-03 LAB — HEMOGLOBIN AND HEMATOCRIT, BLOOD: HCT: 36.4 % (ref 36.0–46.0)

## 2012-01-03 LAB — CREATININE, FLUID (PLEURAL, PERITONEAL, JP DRAINAGE): Creat, Fluid: 0.9 mg/dL

## 2012-01-03 LAB — GLUCOSE, CAPILLARY: Glucose-Capillary: 170 mg/dL — ABNORMAL HIGH (ref 70–99)

## 2012-01-03 MED ORDER — OXYCODONE-ACETAMINOPHEN 5-325 MG PO TABS: 1.0000 | ORAL_TABLET | Freq: Four times a day (QID) | ORAL | Status: DC | PRN

## 2012-01-03 MED ORDER — DOCUSATE SODIUM 100 MG PO CAPS: 100.0000 mg | ORAL_CAPSULE | Freq: Two times a day (BID) | ORAL | Status: DC

## 2012-01-03 NOTE — Progress Notes (Signed)
Patient ID: Grace [name], female   DOB: Jul 30, 1950, 61 y.o.   MRN: [id number]  2 Days Post-Op Subjective: Pt doing well.  She had a BM last night and is passing flatus.  Tolerating regular diet. Pain is controlled with po pain meds.  Objective: Vital signs in last 24 hours: Temp:  [98.2 F (36.8 C)-99.8 F (37.7 C)] 98.2 F (36.8 C) (09/28 0443) Pulse Rate:  [75-89] 75  (09/28 0443) Resp:  [16-18] 16  (09/28 0443) BP: (120-150)/(54-95) 120/95 mmHg (09/28 0443) SpO2:  [96 %-100 %] 97 % (09/28 0443)  Intake/Output from previous day: 09/27 0701 - 09/28 0700 In: 2413.8 [P.O.:480; I.V.:1933.8] Out: 3395 [Urine:3300; Drains:95] Intake/Output this shift: Total I/O In: -  Out: 300 [Urine:300]  Physical Exam:  General: Alert and oriented CV: RRR Lungs: Clear Abdomen: Soft, ND, Positive bowel sounds Incisions: C/D/I Ext: NT, No erythema  Lab Results:  Basename 01/03/12 0519 01/02/12 0458 01/01/12 1130  HGB 11.9* 12.4 13.7  HCT 36.4 37.6 39.7   BMET  Basename 01/03/12 0519 01/02/12 0458  NA 135 136  K 4.1 4.0  CL 97 98  CO2 32 29  GLUCOSE 180* 148*  BUN 8 11  CREATININE 0.84 1.00  CALCIUM 8.7 8.9     Studies/Results: No results found.  Pathology pending  Assessment/Plan: - Check JP creatinine - Saline lock IV - Ambulate, IS - Probable D/C home later today   LOS: 2 days   Kiptyn Rafuse,LES 01/03/2012, 8:06 AM

## 2012-01-03 NOTE — Discharge Summary (Signed)
Date of admission: 01/01/2012  Date of discharge: 01/03/2012  Admission diagnosis: Right renal neoplasm   Discharge diagnosis: Right renal neoplasm  Secondary diagnoses: Diabetes  History and Physical: For full details, please see admission history and physical. Briefly, Grace [name] is a 61 y.o. year old patient with a right renal neoplasm concerning for malignancy.  Her metastatic evaluation was negative.   Hospital Course: She underwent a robotic assisted laparoscopic right partial nephrectomy on 01/01/12. She remained hemodynamically stable after a period of bedrest for about 24 hrs.  She began ambulating on POD #1 and her diet was advanced.  She was transitioned to oral pain medication.  Her perinephric drain had minimal output and on POD #2 it was checked for a creatinine level and was consistent was serum and was therefore removed.  She was felt to be stable for discharge on the afternoon of POD #2. Her pathology is pending at the time of discharge.  Laboratory values:  Basename 01/03/12 0519 01/02/12 0458 01/01/12 1130  HGB 11.9* 12.4 13.7  HCT 36.4 37.6 39.7    Basename 01/03/12 0519 01/02/12 0458  CREATININE 0.84 1.00    Disposition: Home  Discharge instruction: The patient was instructed to be ambulatory but told to refrain from heavy lifting, strenuous activity, or driving.   Discharge medications:    Medication List     As of 01/03/2012  2:59 PM    START taking these medications         docusate sodium 100 MG capsule   Commonly known as: COLACE   Take 1 capsule (100 mg total) by mouth 2 (two) times daily.      oxyCODONE-acetaminophen 5-325 MG per tablet   Commonly known as: PERCOCET/ROXICET   Take 1-2 tablets by mouth every 6 (six) hours as needed.      CONTINUE taking these medications         acetaminophen 500 MG tablet   Commonly known as: TYLENOL      diphenhydrAMINE 25 mg capsule   Commonly known as: BENADRYL      fluticasone 50 MCG/ACT nasal  spray   Commonly known as: FLONASE      furosemide 40 MG tablet   Commonly known as: LASIX      glipiZIDE 5 MG 24 hr tablet   Commonly known as: GLUCOTROL XL      lisinopril 20 MG tablet   Commonly known as: PRINIVIL,ZESTRIL      meloxicam 15 MG tablet   Commonly known as: MOBIC      metFORMIN 1000 MG tablet   Commonly known as: GLUCOPHAGE      potassium chloride SA 20 MEQ tablet   Commonly known as: K-DUR,KLOR-CON      simvastatin 20 MG tablet   Commonly known as: ZOCOR      STOP taking these medications         aspirin EC 81 MG tablet      loperamide 2 MG capsule   Commonly known as: IMODIUM      multivitamin with minerals Tabs          Where to get your medications    These are the prescriptions that you need to pick up.   You may get these medications from any pharmacy.         docusate sodium 100 MG capsule   oxyCODONE-acetaminophen 5-325 MG per tablet            Followup:  Follow-up Information    Follow  up with Crecencio Mc, MD. On 01/27/2012. (at 10:30)    Contact information:   [address] [place] [address] [phone]

## 2012-01-03 NOTE — Progress Notes (Signed)
Patient discharge to home, alert and oriented, no distress, incision site clean and intact no s/s of infection noted. PIV removed no s/s of infiltration or swelling noted.D/C instructions done and given to the patient.

## 2012-02-20 NOTE — Progress Notes (Signed)
This is to note that this patient was diagnosed with renal cell carcinoma.

## 2012-08-04 ENCOUNTER — Ambulatory Visit (HOSPITAL_COMMUNITY)
Admission: RE | Admit: 2012-08-04 | Discharge: 2012-08-04 | Disposition: A | Discharge status: Home / Self Care | Payer: BC Managed Care – PPO | Source: Ambulatory Visit | Attending: Urology | Admitting: Urology

## 2012-08-04 ENCOUNTER — Other Ambulatory Visit (HOSPITAL_COMMUNITY): Payer: Self-pay | Admitting: Urology

## 2012-08-04 DIAGNOSIS — E119 Type 2 diabetes mellitus without complications: Secondary | ICD-10-CM | POA: Insufficient documentation

## 2012-08-04 DIAGNOSIS — C649 Malignant neoplasm of unspecified kidney, except renal pelvis: Secondary | ICD-10-CM

## 2012-08-04 DIAGNOSIS — I10 Essential (primary) hypertension: Secondary | ICD-10-CM | POA: Insufficient documentation

## 2013-02-18 ENCOUNTER — Ambulatory Visit (HOSPITAL_COMMUNITY)
Admission: RE | Admit: 2013-02-18 | Discharge: 2013-02-18 | Disposition: A | Discharge status: Home / Self Care | Payer: BC Managed Care – PPO | Source: Ambulatory Visit | Attending: Urology | Admitting: Urology

## 2013-02-18 ENCOUNTER — Other Ambulatory Visit (HOSPITAL_COMMUNITY): Payer: Self-pay | Admitting: Urology

## 2013-02-18 DIAGNOSIS — C649 Malignant neoplasm of unspecified kidney, except renal pelvis: Secondary | ICD-10-CM

## 2013-02-18 DIAGNOSIS — Z85528 Personal history of other malignant neoplasm of kidney: Secondary | ICD-10-CM | POA: Insufficient documentation

## 2013-04-07 HISTORY — PX: HERNIA REPAIR: SHX51

## 2013-07-13 IMAGING — CT CT ABD-PELV W/O CM
1 of 2 series · 15 of 32 positions shown, 19 images · non-contrast
Comparison: none

REASON FOR EXAM: (1) right flank pain; (2) right flank pain
COMMENTS:   May transport without cardiac monitor

PROCEDURE:     CT  - CT ABDOMEN AND PELVIS W[DATE]  [DATE]
RESULT:
TECHNIQUE: Helical noncontrast 3 mm sections were obtained from the lung
bases through the pubic symphysis.

[Series 2: 3mm soft tissue · axial · 0.86mm/px · z∈[-414,+36]mm · 15 of 166 slices shown, 19 images]
[im 8/166  soft-tissue]
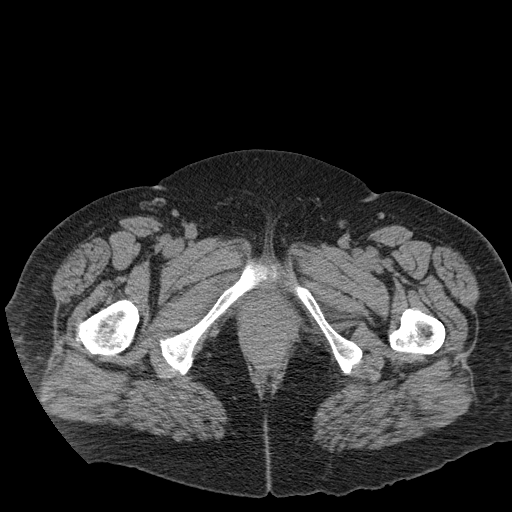
[im 8/166  bone]
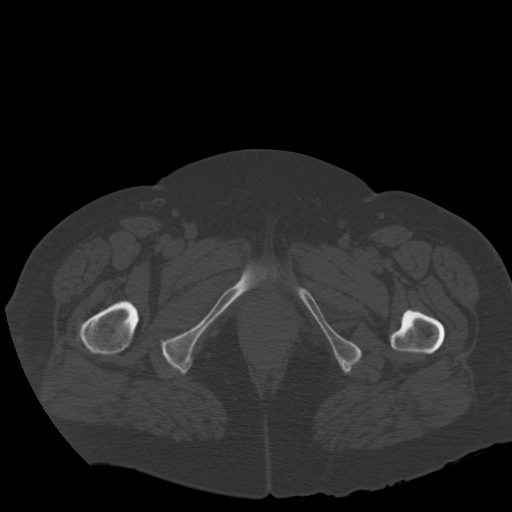
[im 22/166  soft-tissue]
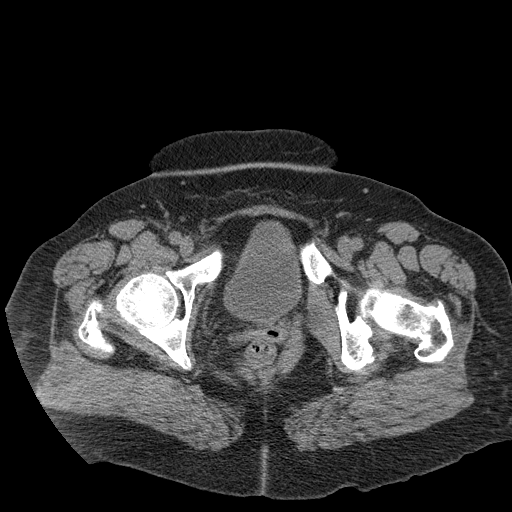
[im 36/166  soft-tissue]
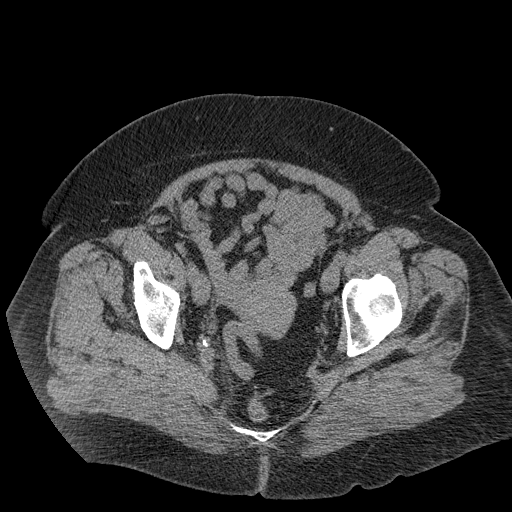
[im 44/166  soft-tissue]
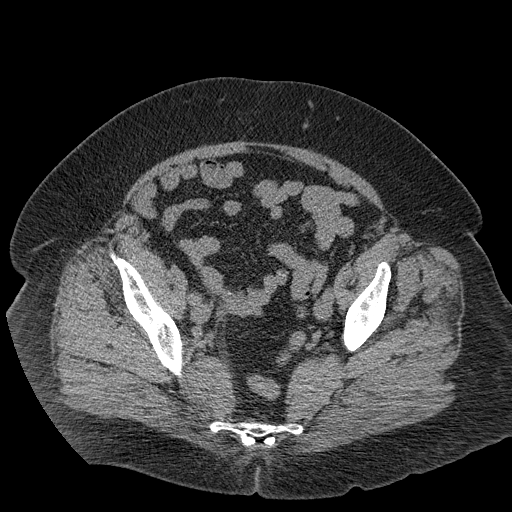
[im 58/166  soft-tissue]
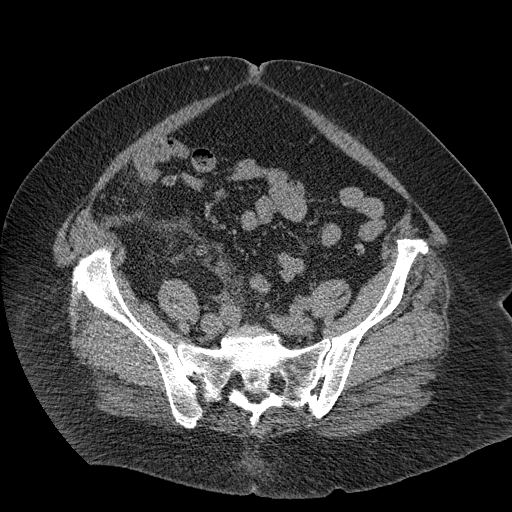
[im 72/166  soft-tissue]
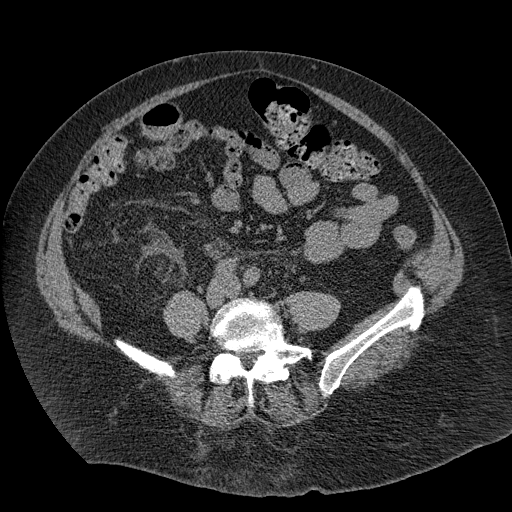
[im 87/166  soft-tissue]
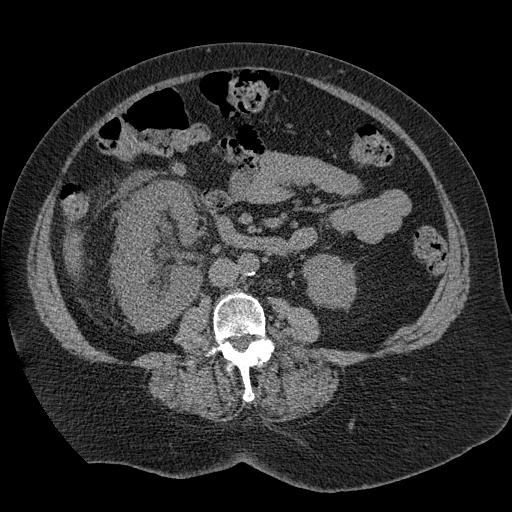
[im 94/166  soft-tissue]
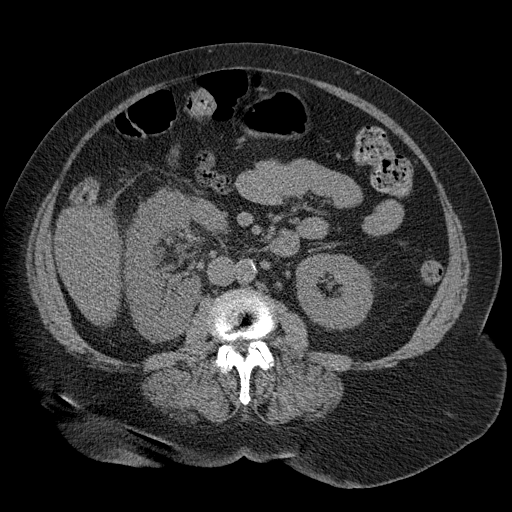
[im 108/166  soft-tissue]
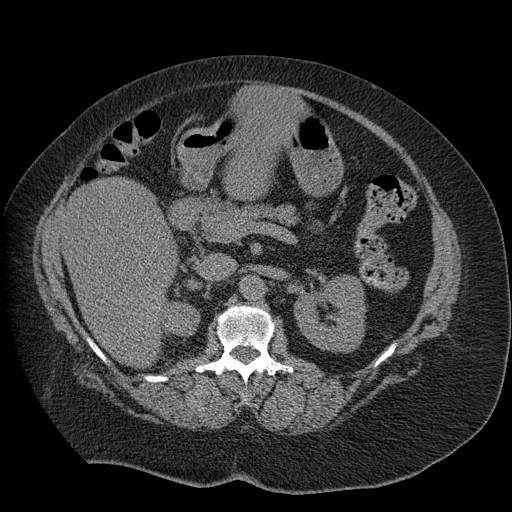
[im 108/166  bone]
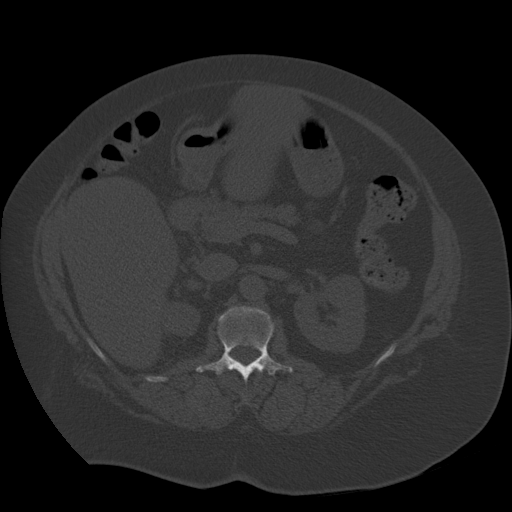
[im 122/166  soft-tissue]
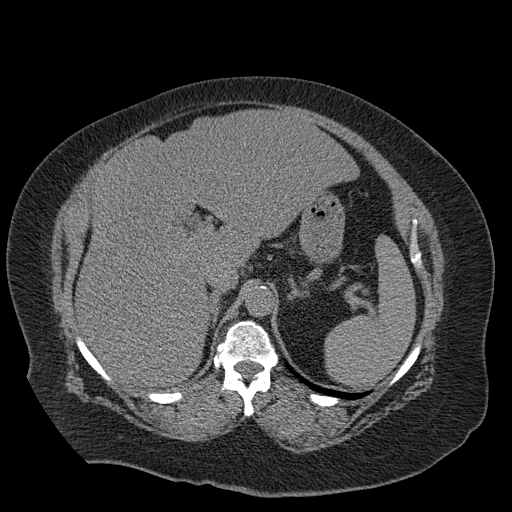
[im 130/166  soft-tissue]
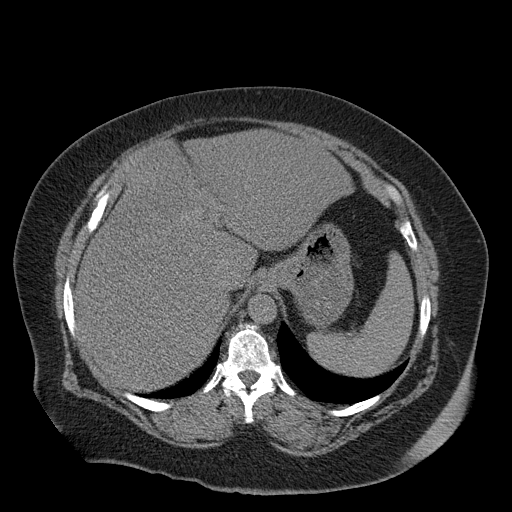
[im 137/166  lung]
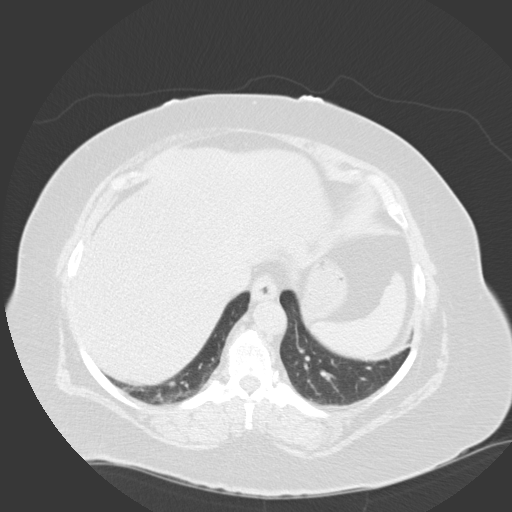
[im 144/166  soft-tissue]
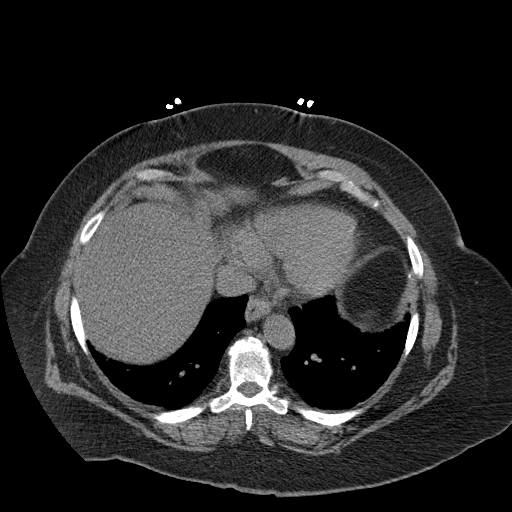
[im 144/166  lung]
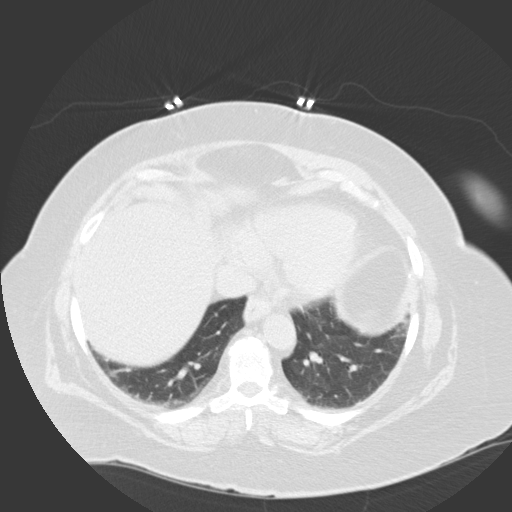
[im 151/166  lung]
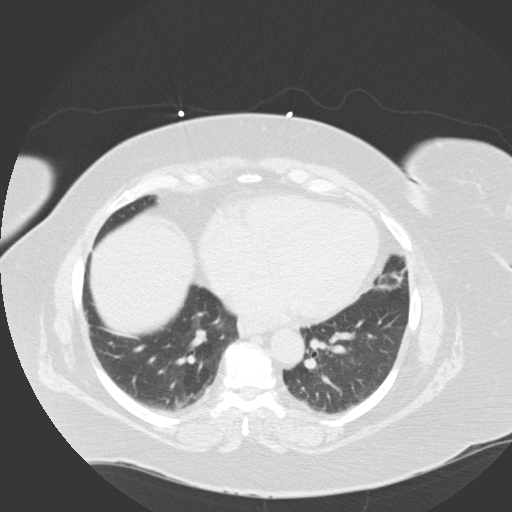
[im 158/166  soft-tissue]
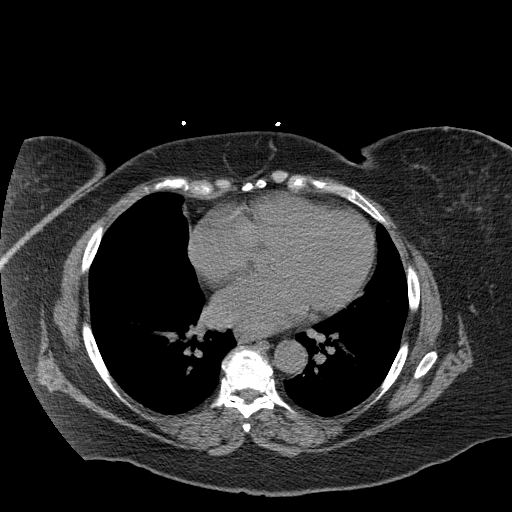
[im 158/166  lung]
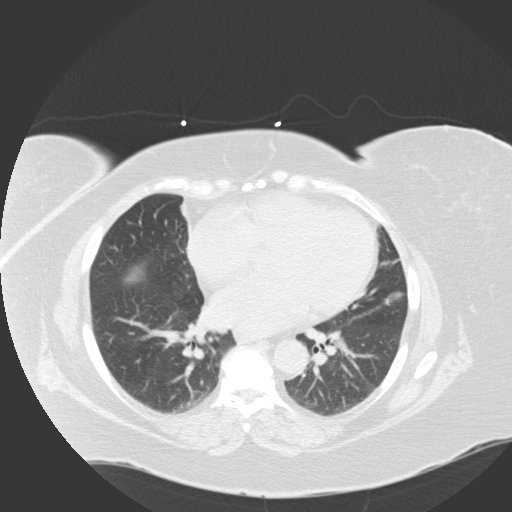

[15 of 32 positions shown; findings below may reference images not displayed]

FINDINGS: Minimal bibasilar atelectasis is identified. Mild hydronephrosis
secondary to a 4.2 mm calculus is appreciated within the distal right
ureter. There is arising from the posterior upper portion of the right
kidney a nodular area measuring approximately 3.6 cm. This area has a more
soft tissue than cystic appearance. There is stranding within the
perinephric fat as well as edema involving the right kidney. Stranding
within the periureteral fat is appreciated.

There is no evidence of bowel obstruction, enteritis, colitis,
diverticulitis, or appendicitis.

Evaluation of the liver demonstrates a vague, low attenuating focus within
the anterior portion of the right lobe of the liver. This may represent
volume averaging with the gallbladder though a nodule within this area
cannot be excluded. The liver otherwise demonstrates a diffuse low
attenuating architecture. The spleen, adrenals, and pancreas are
unremarkable. Nonobstructing nephrolithiasis is identified on the left.
There is no CT evidence of an abdominal aortic aneurysm.
IMPRESSION: 1.  Distal 4.2 mm, right ureteral calculus with associated mild obstructive
uropathy.
2.  Findings suspicious for a soft tissue mass within the upper pole of the
right kidney. Further evaluation with triphasic CT and/or MRI is recommended
and possibly urologic consultation.
3.  Nonspecific, poorly defined, 2 cm, low attenuating nodular area within
the liver as described above.
4.  Nonobstructing nephrolithiasis involving the left kidney.
5.  Findings which may represent extravasation involving the right kidney or
possibly perinephric or periureteral inflammatory changes.
6.  Dr. Ashely of the Emergency Department was informed of these findings
via a preliminary faxed report.

## 2013-07-26 ENCOUNTER — Ambulatory Visit: Payer: Self-pay | Admitting: Surgery

## 2013-07-26 LAB — CBC
HCT: 40.1 % (ref 35.0–47.0)
HGB: 13.4 g/dL (ref 12.0–16.0)
MCH: 30 pg (ref 26.0–34.0)
MCHC: 33.4 g/dL (ref 32.0–36.0)
MCV: 90 fL (ref 80–100)
Platelet: 248 10*3/uL (ref 150–440)
RBC: 4.46 10*6/uL (ref 3.80–5.20)
RDW: 14.5 % (ref 11.5–14.5)
WBC: 11 10*3/uL (ref 3.6–11.0)

## 2013-07-26 LAB — BASIC METABOLIC PANEL
Anion Gap: 3 — ABNORMAL LOW (ref 7–16)
BUN: 19 mg/dL — AB (ref 7–18)
CALCIUM: 9.1 mg/dL (ref 8.5–10.1)
CHLORIDE: 106 mmol/L (ref 98–107)
CO2: 32 mmol/L (ref 21–32)
CREATININE: 0.89 mg/dL (ref 0.60–1.30)
GLUCOSE: 128 mg/dL — AB (ref 65–99)
Osmolality: 285 (ref 275–301)
Potassium: 4.2 mmol/L (ref 3.5–5.1)
SODIUM: 141 mmol/L (ref 136–145)

## 2013-08-02 ENCOUNTER — Ambulatory Visit: Payer: Self-pay | Admitting: Surgery

## 2013-08-03 LAB — PATHOLOGY REPORT

## 2013-08-07 LAB — WOUND CULTURE

## 2013-09-21 ENCOUNTER — Ambulatory Visit (HOSPITAL_COMMUNITY)
Admission: RE | Admit: 2013-09-21 | Discharge: 2013-09-21 | Disposition: A | Discharge status: Home / Self Care | Payer: BC Managed Care – PPO | Source: Ambulatory Visit | Attending: Urology | Admitting: Urology

## 2013-09-21 ENCOUNTER — Other Ambulatory Visit (HOSPITAL_COMMUNITY): Payer: Self-pay | Admitting: Urology

## 2013-09-21 DIAGNOSIS — C649 Malignant neoplasm of unspecified kidney, except renal pelvis: Secondary | ICD-10-CM

## 2014-04-03 ENCOUNTER — Ambulatory Visit (HOSPITAL_COMMUNITY)
Admission: RE | Admit: 2014-04-03 | Discharge: 2014-04-03 | Disposition: A | Discharge status: Home / Self Care | Payer: BC Managed Care – PPO | Source: Ambulatory Visit | Attending: Urology | Admitting: Urology

## 2014-04-03 ENCOUNTER — Other Ambulatory Visit (HOSPITAL_COMMUNITY): Payer: Self-pay | Admitting: Urology

## 2014-04-03 DIAGNOSIS — C649 Malignant neoplasm of unspecified kidney, except renal pelvis: Secondary | ICD-10-CM

## 2014-04-03 DIAGNOSIS — Z87891 Personal history of nicotine dependence: Secondary | ICD-10-CM | POA: Insufficient documentation

## 2014-04-03 DIAGNOSIS — E119 Type 2 diabetes mellitus without complications: Secondary | ICD-10-CM | POA: Insufficient documentation

## 2014-05-08 ENCOUNTER — Ambulatory Visit: Payer: Self-pay | Admitting: Orthopedic Surgery

## 2014-05-08 LAB — URINALYSIS, COMPLETE
BACTERIA: NONE SEEN
BILIRUBIN, UR: NEGATIVE
BLOOD: NEGATIVE
GLUCOSE, UR: NEGATIVE mg/dL (ref 0–75)
Ketone: NEGATIVE
Leukocyte Esterase: NEGATIVE
Nitrite: NEGATIVE
Ph: 5 (ref 4.5–8.0)
Protein: 30
RBC,UR: NONE SEEN /HPF (ref 0–5)
SPECIFIC GRAVITY: 1.019 (ref 1.003–1.030)
WBC UR: <1 /HPF (ref 0–5)

## 2014-05-08 LAB — BASIC METABOLIC PANEL
ANION GAP: 3 — AB (ref 7–16)
BUN: 17 mg/dL (ref 7–18)
CALCIUM: 9.5 mg/dL (ref 8.5–10.1)
CHLORIDE: 107 mmol/L (ref 98–107)
CO2: 30 mmol/L (ref 21–32)
Creatinine: 0.86 mg/dL (ref 0.60–1.30)
EGFR (African American): >60
EGFR (Non-African Amer.): >60
Glucose: 143 mg/dL — ABNORMAL HIGH (ref 65–99)
OSMOLALITY: 283 (ref 275–301)
Potassium: 3.7 mmol/L (ref 3.5–5.1)
Sodium: 140 mmol/L (ref 136–145)

## 2014-05-08 LAB — PROTIME-INR
INR: 0.9
Prothrombin Time: 12.6 secs

## 2014-05-08 LAB — CBC
HCT: 40.3 % (ref 35.0–47.0)
HGB: 13.5 g/dL (ref 12.0–16.0)
MCH: 30.3 pg (ref 26.0–34.0)
MCHC: 33.4 g/dL (ref 32.0–36.0)
MCV: 91 fL (ref 80–100)
Platelet: 253 10*3/uL (ref 150–440)
RBC: 4.45 10*6/uL (ref 3.80–5.20)
RDW: 14.2 % (ref 11.5–14.5)
WBC: 11.9 10*3/uL — AB (ref 3.6–11.0)

## 2014-05-08 LAB — APTT: ACTIVATED PTT: 27.2 s (ref 23.6–35.9)

## 2014-05-11 ENCOUNTER — Ambulatory Visit: Payer: Self-pay | Admitting: Specialist

## 2014-05-13 LAB — CBC WITH DIFFERENTIAL/PLATELET
BASOS ABS: 0.2 10*3/uL — AB (ref 0.0–0.1)
BASOS PCT: 1.5 %
EOS PCT: 4.4 %
Eosinophil #: 0.6 10*3/uL (ref 0.0–0.7)
HCT: 39.4 % (ref 35.0–47.0)
HGB: 12.7 g/dL (ref 12.0–16.0)
LYMPHS ABS: 1.3 10*3/uL (ref 1.0–3.6)
LYMPHS PCT: 10.2 %
MCH: 29.7 pg (ref 26.0–34.0)
MCHC: 32.2 g/dL (ref 32.0–36.0)
MCV: 92 fL (ref 80–100)
MONOS PCT: 8.2 %
Monocyte #: 1 x10 3/mm — ABNORMAL HIGH (ref 0.2–0.9)
NEUTROS ABS: 9.5 10*3/uL — AB (ref 1.4–6.5)
Neutrophil %: 75.7 %
Platelet: 225 10*3/uL (ref 150–440)
RBC: 4.28 10*6/uL (ref 3.80–5.20)
RDW: 14.2 % (ref 11.5–14.5)
WBC: 12.6 10*3/uL — AB (ref 3.6–11.0)

## 2014-05-13 LAB — BASIC METABOLIC PANEL
Anion Gap: 6 — ABNORMAL LOW (ref 7–16)
BUN: 17 mg/dL (ref 7–18)
CHLORIDE: 100 mmol/L (ref 98–107)
CO2: 34 mmol/L — AB (ref 21–32)
CREATININE: 0.83 mg/dL (ref 0.60–1.30)
Calcium, Total: 8.1 mg/dL — ABNORMAL LOW (ref 8.5–10.1)
EGFR (Non-African Amer.): >60
Glucose: 146 mg/dL — ABNORMAL HIGH (ref 65–99)
Osmolality: 284 (ref 275–301)
Potassium: 3.6 mmol/L (ref 3.5–5.1)
SODIUM: 140 mmol/L (ref 136–145)

## 2014-07-29 NOTE — Op Note (Signed)
PATIENT NAME:  Grace [name], Grace [name] MR#:  [id number] DATE OF BIRTH:  October 29, 1950  DATE OF PROCEDURE:  08/02/2013  PREOPERATIVE DIAGNOSIS:  Ventral hernia.   POSTOPERATIVE DIAGNOSIS:  Ventral hernia.   PROCEDURE:  Ventral hernia repair.   SURGEON:  Rochel Brome, M.D.   ANESTHESIA:  General.   INDICATIONS:  This 64 year old female has [name] history of laparoscopic partial right nephrectomy and has developed [name] bulge at one of the operative sites in the right mid abdomen. The bulge was large in size, some 15 cm. [name] large ventral hernia was demonstrated and repair was recommended for definitive treatment.   The patient was placed on the operating table in the supine position under general endotracheal anesthesia. The abdomen was very obese and the large mass was marked with [name] pen. There were no skin abnormalities other than striae and old scar. No erythema was seen. The abdomen was prepared with ChloraPrep and draped in [name] sterile manner.   [name] right mid abdominal transversely oriented incision was made, which after lengthening it during the course of the procedure, ultimately was some 11 mm in dimension. It was noted that with the initial incision there was [name] small pocket of subcutaneous pus which appeared to be approximately 0.25 mL. I saw no surrounding erythema. [name] swab was submitted for routine culture and stat Gram stain and then continued with the incision. It was noted that all of this pus was completely removed. There was no other [name] similar finding deeper within the tissues. Dissection was carried down through subcutaneous tissues to encounter [name] large ventral hernia sac. There was much scar tissue and [name] somewhat tedious dissection to dissect the sac away from surrounding tissues. The fascial ring defect was approximately 6 cm in dimension, and the sac was dissected free from the fascia circumferentially and excised, and with her history of kidney cancer, did elect to send for routine pathology. The fascial  layers were well demonstrated, and at this point we did not have the report of the Gram stain, so I elected not to insert any mesh and elected to use 0 Maxon figure-of-eight sutures, which should dissolve in about 6 months; best not to use any permanent material, so the repair was carried out with [name] transversely oriented suture line of interrupted 0 Maxon figure-of-eight sutures. The repair looked good. The fascia lateral to the repair site was infiltrated with 0.5% Sensorcaine with epinephrine. It is further noted hemostasis was intact, as electrocautery had been used during the course of the procedure. Estimated blood loss was 10 mL for the procedure. The subcutaneous tissues were closed with 4-0 Monocryl, as there was [name] layer of Scarpa's fascia. Next, the skin was closed with [name] running 4-0 Monocryl subcuticular suture and Dermabond. The patient tolerated surgery satisfactorily and was prepared for transfer to the recovery room.    ____________________________ Lenna Sciara. Rochel Brome, MD jws:dmm D: 08/02/2013 09:07:39 ET T: 08/02/2013 10:36:11 ET JOB#: [id number]  cc: Loreli Dollar, MD, <Dictator> Loreli Dollar MD ELECTRONICALLY SIGNED 08/05/2013 9:29

## 2014-08-06 NOTE — Op Note (Signed)
PATIENT NAME:  Grace Grace [name], Grace Grace [name] MR#:  [id number] DATE OF BIRTH:  1950-06-10  DATE OF PROCEDURE:  05/11/2014  PREOPERATIVE DIAGNOSIS: Left shoulder full thickness rotator cuff tear, subacromial impingement, and acromioclavicular arthrosis.   POSTOPERATIVE DIAGNOSIS: Left shoulder full thickness rotator cuff tear, subacromial impingement, and acromioclavicular arthrosis.   PROCEDURE: Left shoulder arthroscopic subacromial decompression, distal clavicle excision, and mini open rotator cuff repair.   ANESTHESIA: General endotracheal intubation with left interscalene block plus local with 1% lidocaine plain.   SURGEON: Grace Park, MD.    ESTIMATED BLOOD LOSS: 30 mL.   COMPLICATIONS: None.   IMPLANTS: ArthroCare Magnum M anchors x 3 and Magnum 2 anchors x 2.   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 She had immediate pain in the left shoulder and had significant limitation of motion. An MRI confirmed Grace [name] full-thickness tear of the rotator cuff. She did not respond to nonoperative intervention and wished to proceed with surgical fixation. I had reviewed the risks and benefits of surgery with the patient. She understands the risks including infection, bleeding, nerve or blood vessel injury, shoulder stiffness, persistent pain, re-tear of the rotator cuff, hardware failure, and the need for further surgery. Medical risks include but are not limited to DVT and pulmonary embolism, myocardial infarction, stroke, pneumonia, respiratory failure, and death. The patient understood these risks and wished to proceed.   PROCEDURE NOTE: The patient was met in the preoperative area. I signed the left shoulder with my initials and the word "yes" according to the hospital's correct site of surgery protocol. The patient had her  history and physical updated. She was brought to the operating room. She underwent Grace [name] left interscalene block by the anesthesia service and then underwent general endotracheal intubation. She was positioned in Grace [name] beach chair position. All bony prominences were adequately padded. Examination under anesthesia revealed full passive left shoulder range of motion and no instability on load and shift testing and she had Grace [name] negative sulcus sign.   The patient was prepped and draped in Grace [name] sterile fashion. Grace [name] timeout was performed to verify the patient's name, date of birth, medical record number, correct site of surgery, and correct procedure to be performed. It was also used to verify the patient had received antibiotics and all appropriate instruments, implants, and radiographic studies were available in the room. Once all in attendance were in agreement the case began.   The patient's bony architecture was drawn out with Grace [name] surgical marker along with proposed arthroscopy incisions. These were pre-injected with 1% lidocaine plain. An 11 blade was used to establish Grace [name] posterior portal through which the arthroscope was placed into the glenohumeral joint. An anterior portal was established under direct visualization using an 18-gauge spinal needle for localization. Grace [name] 5.75 mm arthroscopic cannula was placed through the anterior portal. Grace [name] full diagnostic examination of the shoulder was undertaken. Findings on arthroscopy included Grace [name] full-thickness tear of the supraspinatus likely extending into the anterior portion the infraspinatus. The patient had Grace [name] torn biceps tendon. There were no focal deficits of the glenoid or humeral head articular surfaces. The patient had fraying of the superior labrum, but no overt tear of the labrum from the bony glenoid. There were no loose bodies in the inferior recess and no evidence of Grace [name] HAGL lesion.   The patient  had the biceps tendon stump debrided. The Smart Stitches were placed in the lateral  border of the rotator cuff. The arthroscope was then placed into the subacromial space. Grace [name] lateral arthroscopic portal had been created again using an 18-gauge spinal needle for direct visualization for placement of the Smart Stitches. The lateral portal was also used to perform Grace [name] subacromial decompression with Grace [name] 5.5 mm resector shaver blade. The shaver blade was then placed through the anterior portal and Grace [name] distal clavicle excision was performed. The bursectomy was also performed through the lateral portal with Grace [name] 4-0 resector shaver blade and 90 degree ArthroCare wand. It was appreciated by arthroscope that there was Grace [name] large crescentic tear in the rotator cuff that required Grace [name] marginal convergence. The arthroscopic instruments were then removed.   Grace [name] saber-type incision was made along the lateral border of the acromion. The deltoid muscle was identified and split in line with its fibers to allow for visualization of the rotator cuff. Three Magnum M anchors were placed in the greater tuberosity after the greater tuberosity had been debrided of torn fibers of the rotator cuff with Grace [name] 5.5 mm resector shaver blade. Punctate bleeding was achieved to assist in the rotator cuff tendon healing. The Magnum M anchors were placed in the footprint of the greater tuberosity spaced by Grace [name] minimum of 5 mm from each other. Sutures from these Magnum M anchors were then passed through the anterior and posterior edges of the rotator cuff with Grace [name] Firstpass suture passer. These were then tied down using an arthroscopic knot pusher. This allowed for marginal convergence of the tear as well as fixation to the greater tuberosity. Once the marginal convergence was completed and the rotator cuff was well fixed to the footprint of the greater tuberosity the 2 Smart Stitches which had been placed along the lateral border of the rotator cuff were then affixed for lateral row fixation using 2 Magnum 2 anchors. The wound was then copiously irrigated.  Images with the arthroscope were taken of the rotator cuff repair both externally through the deltoid split incision as well as from the glenohumeral joint arthroscopically. All instruments were then removed. The deltoid fascia was closed with an interrupted 0 Vicryl suture, the subcutaneous tissue closed with 2-0 Vicryl and the skin of the saber incision was closed with Grace [name] 4-0 undyed Monocryl.  2-0 Vicryls were used to close the subcutaneous layer of the arthroscopic portal incisions and the skin of the portal incisions was approximated using 4-0 nylon. Steri-Strips and Xeroform were applied over the incisions. The patient had Grace [name] dry sterile dressing applied along with TENS unit pads, Grace [name] Polar Care sleeve, and an abduction sling. The patient was then awoken and brought to the PACU in stable condition. I was scrubbed and present for the entire case and I spoke with the patient's husband in the postoperative consultation room to let him know the case had gone without complication. The patient was stable in the recovery room. The patient ended up having significant pain in the left shoulder postoperatively and was admitted to the hospital for pain control and observation.    ____________________________ Timoteo Gaul, MD klk:bu D: 05/19/2014 18:48:42 ET T: 05/19/2014 19:15:57 ET JOB#: [id number]  cc: Timoteo Gaul, MD, <Dictator> Timoteo Gaul MD ELECTRONICALLY SIGNED 05/29/2014 17:20

## 2014-08-06 NOTE — Consult Note (Signed)
PATIENT NAME:  Grace [name], Grace [name] MR#:  [id number] DATE OF BIRTH:  [birth year]  DATE OF CONSULTATION:  05/12/2014  REFERRING PHYSICIAN:   CONSULTING PHYSICIAN:  Sheri Prows R. Pietra Zuluaga, MD  CONSULTING PHYSICIAN:  Dr. Mack Guise.    REASON FOR CONSULTATION: Acute respiratory failure and acute encephalopathy.   HISTORY OF PRESENTING ILLNESS: [name] 64 year old Caucasian female patient with history of hypertension, diabetes, hyperlipidemia, presented to the hospital for left shoulder cuff repair. In the recovery room the patient had uncontrolled pain and was admitted to the hospital overnight. The patient received [name] dose of IV Dilaudid at midnight, has been receiving oral narcotic pain medications. Today morning the patient had acute encephalopathy, confused, needing 6 liters of oxygen, acting strange, and the hospitalist team was consulted.  On seeing the patient, the patient does seem drowsy. By now she has had [name] CT scan of the head and chest x-ray which are normal. The patient was not given any Narcan as she was awake. Still complains of some pain. The patient's sister at bedside mentions that any time she had surgery with anesthesia or pain medication she tends to be on oxygen and has some respiratory problem. Also the patient's ABG shows acute respiratory acidosis with pCO2 of 62 and pH of 7.32.   The patient does not complain of any shortness of breath, chest pain at this time. She does have [name] cough, complains of significant pain in her left shoulder area.   PAST MEDICAL HISTORY:  1. Hyperlipidemia.  2. Hypertension.  3. Diabetes.  4. Cholecystectomy.  5. Arthritis.    SOCIAL HISTORY: The patient does not smoke. No alcohol. No illicit drug use. Lives alone. Ambulates on her own.   FAMILY HISTORY: No family history of CAD or stroke.   ALLERGIES: TRAMADOL, COMPAZINE.   REVIEW OF SYSTEMS: Please see history of presenting illness, rest of the systems reviewed and negative.   HOME MEDICATIONS:   1. Simvastatin 20 mg daily.  2. Promethazine 12.5 mg every 4-6 hours as needed.  3. Potassium chloride 20 mEq daily.  4. Multivitamin 1 tablet daily.  5. Metformin 1000 mg 2 times [name] day.  6. Meloxicam 15 mg oral once [name] day.  7. Lisinopril 20 mg daily.  8. Glipizide XL 5 mg 2 times [name] day.  9. Lasix 40 mg daily.  10. Fluticasone 50 mcg 2 sprays nostril daily.  11. Amlodipine 5 mg daily.   PHYSICAL EXAMINATION:  VITAL SIGNS: Temperature 97.5, pulse of 93, blood pressure 163/75, saturating 95% on 4 liters oxygen.  GENERAL: Obese Caucasian female patient sitting up in bed, mildly drowsy, but oriented.  HEENT: Atraumatic, normocephalic. Oral mucosa moist and pink. External ears and nose normal. No pallor. No icterus. Pupils bilaterally equal and reactive to light.  NECK: Supple. No thyromegaly. No palpable lymph nodes.    CARDIOVASCULAR: S1, S2, without any murmurs.  RESPIRATORY: Shallow breathing.  GASTROINTESTINAL: Soft abdomen, nontender.  EXTREMITIES: Has left shoulder tenderness, decreased range of motion, with an ice pack in place.  NEUROLOGICAL: Motor strength 5 out of 5 in upper extremities. Limited assessment of the left upper extremity.   LABORATORY STUDIES: Glucose of 200, BUN 17, creatinine 0.86. WBC 11.9. INR 0.9. ABG shows pH of 7.32 with pCO2 of 62 and pO2 of 71. Lactic acid 1.2.   CT scan of the head looks normal. Chest x-ray shows nothing acute other than mild vascular congestion.   ASSESSMENT AND PLAN:  Acute respiratory failure which is hypercarbic, along with acute encephalopathy  secondary to pain medications contributing to respiratory depression. Presently the patient is slowly improving. I have discussed with her regarding BiPAP, but she has refused.  We will get her [name] breathing treatment, give IV dose of Lasix for fluid overload from fluids during surgery. The patient is slowly improving, can be discharged home if she is off oxygen and feels back to baseline. Would  limit IV pain medication use in this patient who seems to be very sensitive to narcotic medications. The patient might also have some underlying sleep apnea although does not seem to be causing any problems at home.   CRITICAL CARE TIME SPENT ON THIS PATIENT: 40 minutes.     ____________________________ [name], MD srs:bu D: 05/12/2014 14:54:42 ET T: 05/12/2014 15:39:44 ET JOB#: [id number]  cc: Alveta Heimlich R. Satonya Lux, MD, <Dictator> Neita Carp MD ELECTRONICALLY SIGNED 05/15/2014 3:57

## 2015-07-03 ENCOUNTER — Other Ambulatory Visit (HOSPITAL_COMMUNITY): Payer: Self-pay | Admitting: Urology

## 2015-07-03 ENCOUNTER — Ambulatory Visit (HOSPITAL_COMMUNITY)
Admission: RE | Admit: 2015-07-03 | Discharge: 2015-07-03 | Disposition: A | Discharge status: Home / Self Care | Payer: BLUE CROSS/BLUE SHIELD | Source: Ambulatory Visit | Attending: Urology | Admitting: Urology

## 2015-07-03 DIAGNOSIS — C649 Malignant neoplasm of unspecified kidney, except renal pelvis: Secondary | ICD-10-CM

## 2015-07-03 DIAGNOSIS — R918 Other nonspecific abnormal finding of lung field: Secondary | ICD-10-CM | POA: Diagnosis not present

## 2016-02-06 IMAGING — CR DG CHEST 1V PORT
1 series · 2 of 2 positions shown · non-contrast
Comparison: 11/08/2011; report from 01/31/2003.

CLINICAL DATA: Shortness of breath. Postoperative for rotator cuff
repair.

EXAM:
PORTABLE CHEST - 1 VIEW

[Series 1: x chest ap · 0.14mm/px · 2 of 2 slices shown]
[im 1/2]
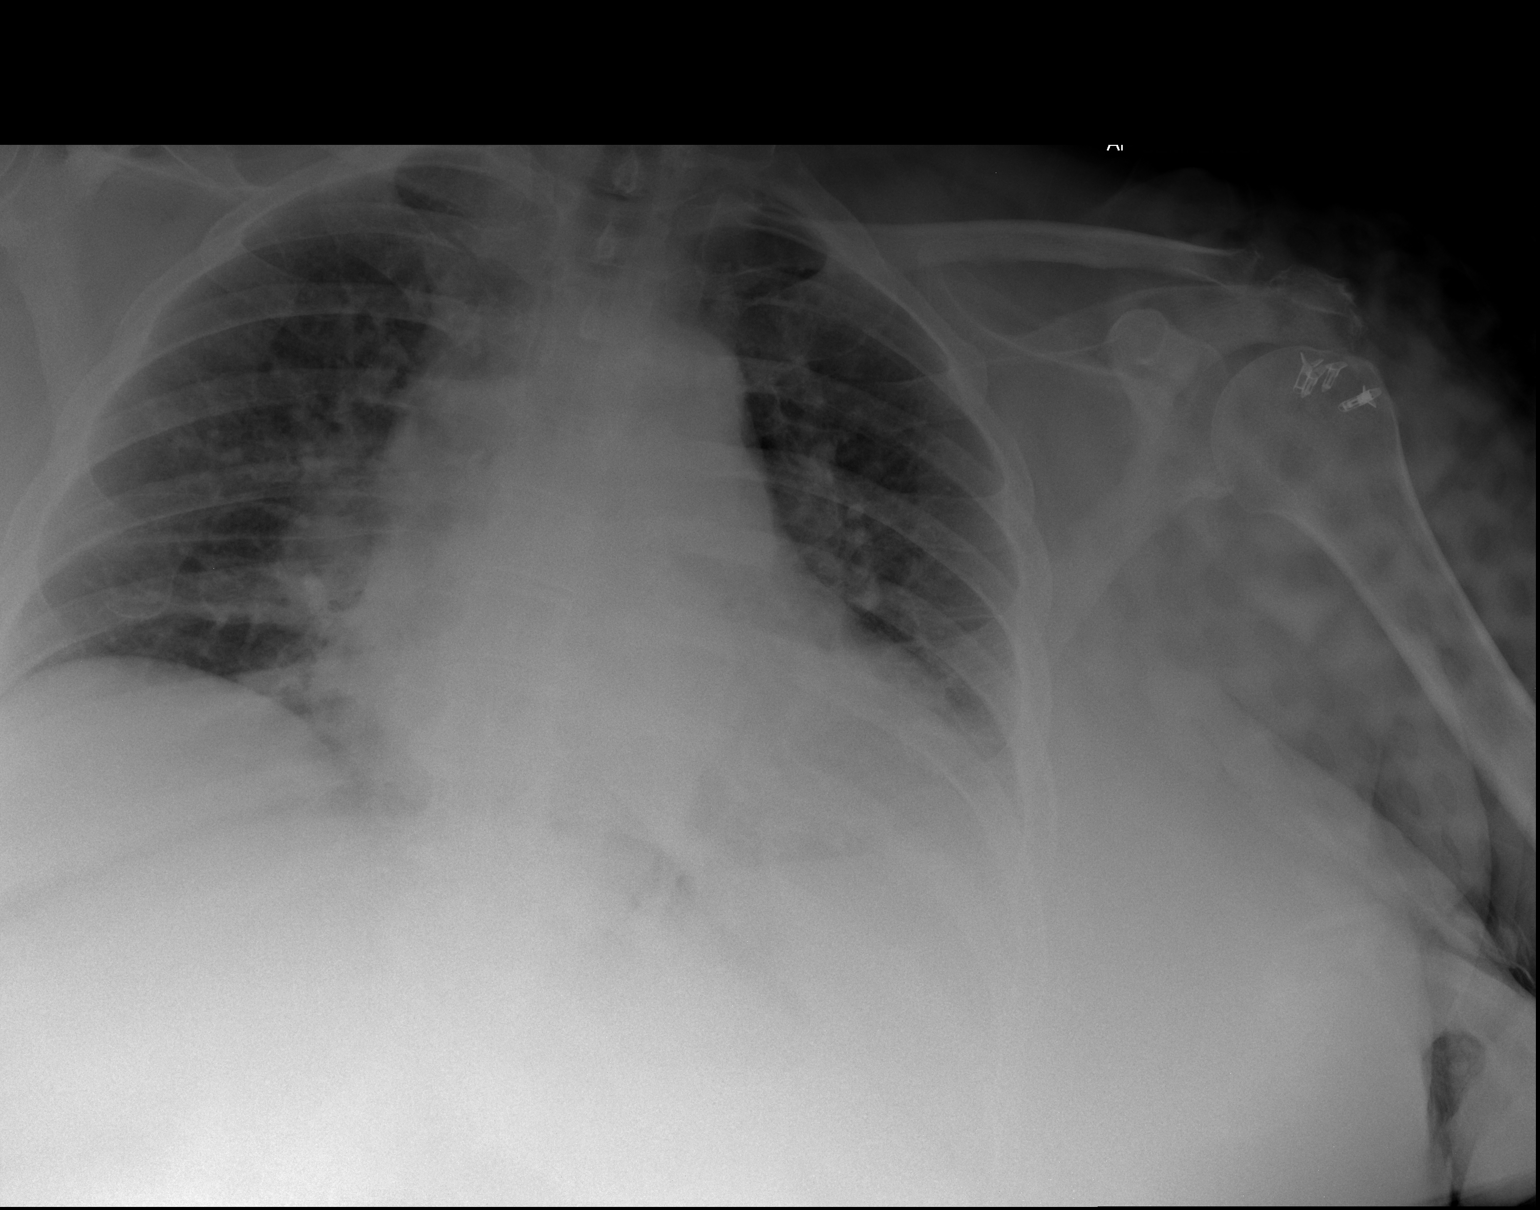
[im 2/2]
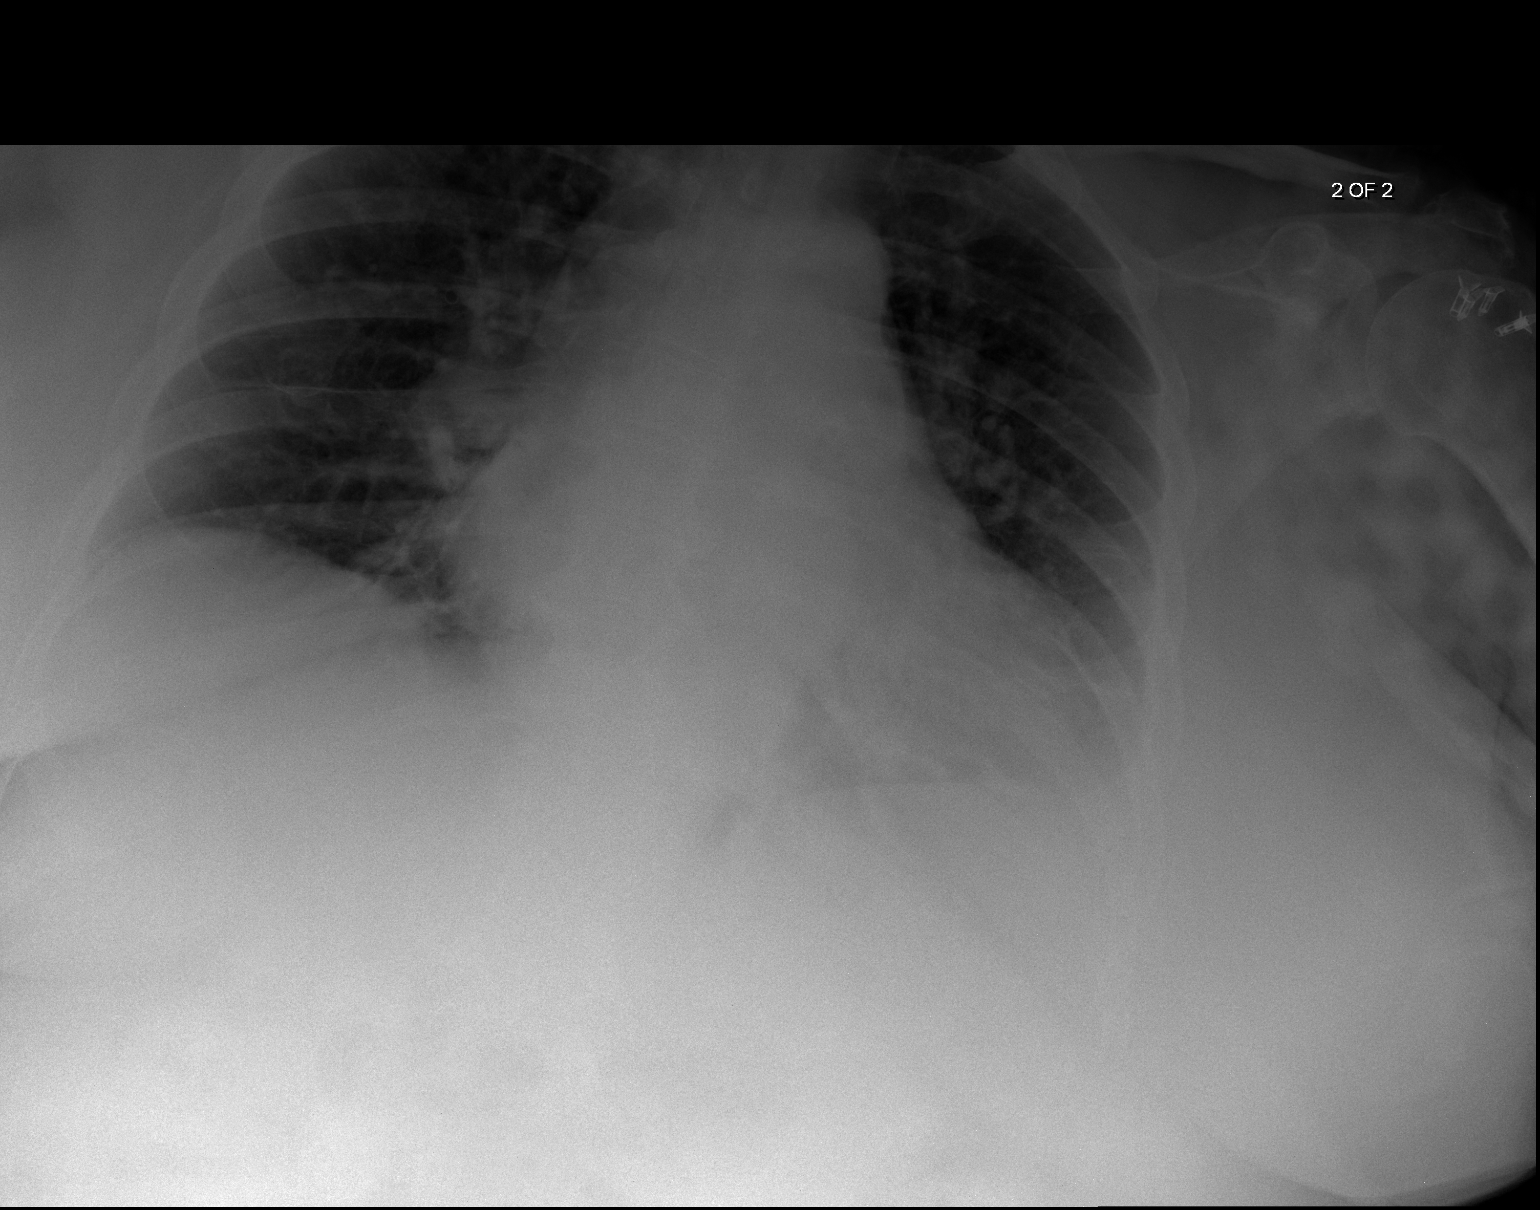

[2 of 2 positions shown; findings below may reference images not displayed]

FINDINGS: Rotator cuff anchors project over the left humeral head.

Low lung volumes are present, causing crowding of the pulmonary
vasculature. Moderate enlargement of the cardiopericardial
silhouette with indistinct pulmonary vasculature. Tortuous thoracic
aorta. Thoracic spondylosis.
IMPRESSION: 1. Moderate enlargement of the cardiopericardial silhouette with
indistinct pulmonary vasculature potentially from pulmonary venous
hypertension, but without overt edema.
2. Low lung volumes are present, causing crowding of the pulmonary
vasculature.

## 2016-10-01 ENCOUNTER — Other Ambulatory Visit: Payer: Self-pay | Admitting: Urology

## 2016-10-01 ENCOUNTER — Other Ambulatory Visit (HOSPITAL_COMMUNITY): Payer: Self-pay | Admitting: Urology

## 2016-10-01 ENCOUNTER — Ambulatory Visit (HOSPITAL_COMMUNITY)
Admission: RE | Admit: 2016-10-01 | Discharge: 2016-10-01 | Disposition: A | Discharge status: Home / Self Care | Payer: BLUE CROSS/BLUE SHIELD | Source: Ambulatory Visit | Attending: Urology | Admitting: Urology

## 2016-10-01 DIAGNOSIS — C641 Malignant neoplasm of right kidney, except renal pelvis: Secondary | ICD-10-CM

## 2016-10-01 DIAGNOSIS — R918 Other nonspecific abnormal finding of lung field: Secondary | ICD-10-CM | POA: Insufficient documentation

## 2016-11-03 ENCOUNTER — Ambulatory Visit (HOSPITAL_COMMUNITY)
Admission: RE | Admit: 2016-11-03 | Discharge: 2016-11-03 | Disposition: A | Discharge status: Home / Self Care | Payer: BLUE CROSS/BLUE SHIELD | Source: Ambulatory Visit | Attending: Urology | Admitting: Urology

## 2016-11-03 ENCOUNTER — Other Ambulatory Visit (HOSPITAL_COMMUNITY): Payer: Self-pay | Admitting: Urology

## 2016-11-03 DIAGNOSIS — C641 Malignant neoplasm of right kidney, except renal pelvis: Secondary | ICD-10-CM

## 2016-11-03 DIAGNOSIS — R918 Other nonspecific abnormal finding of lung field: Secondary | ICD-10-CM | POA: Insufficient documentation

## 2016-12-01 ENCOUNTER — Other Ambulatory Visit (HOSPITAL_COMMUNITY): Payer: Self-pay | Admitting: Urology

## 2016-12-01 ENCOUNTER — Ambulatory Visit (HOSPITAL_COMMUNITY)
Admission: RE | Admit: 2016-12-01 | Discharge: 2016-12-01 | Disposition: A | Discharge status: Home / Self Care | Payer: BLUE CROSS/BLUE SHIELD | Source: Ambulatory Visit | Attending: Urology | Admitting: Urology

## 2016-12-01 DIAGNOSIS — I517 Cardiomegaly: Secondary | ICD-10-CM | POA: Insufficient documentation

## 2016-12-01 DIAGNOSIS — C641 Malignant neoplasm of right kidney, except renal pelvis: Secondary | ICD-10-CM | POA: Diagnosis not present

## 2016-12-01 DIAGNOSIS — R918 Other nonspecific abnormal finding of lung field: Secondary | ICD-10-CM | POA: Insufficient documentation

## 2017-06-26 ENCOUNTER — Other Ambulatory Visit: Payer: Self-pay

## 2017-06-26 ENCOUNTER — Ambulatory Visit
Admission: RE | Admit: 2017-06-26 | Discharge: 2017-06-26 | Disposition: A | Discharge status: Home / Self Care | Payer: Disability Insurance | Source: Ambulatory Visit

## 2017-06-26 DIAGNOSIS — M160 Bilateral primary osteoarthritis of hip: Secondary | ICD-10-CM | POA: Diagnosis present

## 2017-06-26 DIAGNOSIS — M25562 Pain in left knee: Secondary | ICD-10-CM | POA: Insufficient documentation

## 2017-06-26 DIAGNOSIS — Z96653 Presence of artificial knee joint, bilateral: Secondary | ICD-10-CM

## 2017-06-26 DIAGNOSIS — M25561 Pain in right knee: Secondary | ICD-10-CM | POA: Insufficient documentation

## 2017-08-26 ENCOUNTER — Other Ambulatory Visit: Payer: Self-pay | Admitting: Orthopedic Surgery

## 2017-09-08 ENCOUNTER — Other Ambulatory Visit: Payer: Self-pay | Admitting: Orthopedic Surgery

## 2017-09-16 ENCOUNTER — Other Ambulatory Visit: Payer: Self-pay | Admitting: Orthopedic Surgery

## 2017-09-18 ENCOUNTER — Other Ambulatory Visit: Payer: Self-pay

## 2017-09-18 ENCOUNTER — Encounter
Admission: RE | Admit: 2017-09-18 | Discharge: 2017-09-18 | Disposition: A | Discharge status: Home / Self Care | Payer: No Typology Code available for payment source | Source: Ambulatory Visit | Attending: Orthopedic Surgery | Admitting: Orthopedic Surgery

## 2017-09-18 DIAGNOSIS — Z01812 Encounter for preprocedural laboratory examination: Secondary | ICD-10-CM | POA: Insufficient documentation

## 2017-09-18 HISTORY — DX: Personal history of urinary calculi: Z87.442

## 2017-09-18 HISTORY — DX: Malignant (primary) neoplasm, unspecified: C80.1

## 2017-09-18 LAB — BASIC METABOLIC PANEL
Anion gap: 11 (ref 5–15)
BUN: 12 mg/dL (ref 6–20)
CHLORIDE: 101 mmol/L (ref 101–111)
CO2: 29 mmol/L (ref 22–32)
CREATININE: 0.68 mg/dL (ref 0.44–1.00)
Calcium: 9.4 mg/dL (ref 8.9–10.3)
GFR calc non Af Amer: >60 mL/min (ref >60)
GLUCOSE: 138 mg/dL — AB (ref 65–99)
Potassium: 4 mmol/L (ref 3.5–5.1)
Sodium: 141 mmol/L (ref 135–145)

## 2017-09-18 LAB — PROTIME-INR
INR: 0.88
Prothrombin Time: 11.9 seconds (ref 11.4–15.2)

## 2017-09-18 LAB — CBC WITH DIFFERENTIAL/PLATELET
Basophils Absolute: 0.2 10*3/uL — ABNORMAL HIGH (ref 0–0.1)
Basophils Relative: 2 %
Eosinophils Absolute: 0.4 10*3/uL (ref 0–0.7)
Eosinophils Relative: 3 %
HCT: 43.6 % (ref 35.0–47.0)
HEMOGLOBIN: 14.9 g/dL (ref 12.0–16.0)
LYMPHS ABS: 2.3 10*3/uL (ref 1.0–3.6)
Lymphocytes Relative: 21 %
MCH: 31.1 pg (ref 26.0–34.0)
MCHC: 34.1 g/dL (ref 32.0–36.0)
MCV: 91.2 fL (ref 80.0–100.0)
MONO ABS: 0.8 10*3/uL (ref 0.2–0.9)
MONOS PCT: 8 %
NEUTROS PCT: 66 %
Neutro Abs: 7.3 10*3/uL — ABNORMAL HIGH (ref 1.4–6.5)
Platelets: 255 10*3/uL (ref 150–440)
RBC: 4.78 MIL/uL (ref 3.80–5.20)
RDW: 15.1 % — AB (ref 11.5–14.5)
WBC: 10.9 10*3/uL (ref 3.6–11.0)

## 2017-09-18 LAB — APTT: aPTT: 29 seconds (ref 24–36)

## 2017-09-18 NOTE — Patient Instructions (Signed)
Your procedure is scheduled on: September 22, 2017 TUESDAY Report to Day Surgery on the 2nd floor of the [facility]. To find out your arrival time, please call [phone] between 1PM - 3PM on: Monday September 21, 2017   REMEMBER: Instructions that are not followed completely may result in serious medical risk, up to and including death; or upon the discretion of your surgeon and anesthesiologist your surgery may need to be rescheduled.  Do not eat food after midnight the night before your procedure.  No gum chewing, lozengers or hard candies.  You may however, drink CLEAR liquids up to 2 hours before you are scheduled to arrive for your surgery. Do not drink anything within 2 hours of the start of your surgery.  Clear liquids include: - water   Do NOT drink anything that is not on this list.  Type 1 and Type 2 diabetics should only drink water.  No Alcohol for 24 hours before or after surgery.  No Smoking including e-cigarettes for 24 hours prior to surgery.  No chewable tobacco products for at least 6 hours prior to surgery.  No nicotine patches on the day of surgery.  On the morning of surgery brush your teeth with toothpaste and water, you may rinse your mouth with mouthwash if you wish. Do not swallow any toothpaste or mouthwash.  Notify your doctor if there is any change in your medical condition (cold, fever, infection).  Do not wear jewelry, make-up, hairpins, clips or nail polish.  Do not wear lotions, powders, or perfumes. You may NOT wear deodorant.  Do not shave 48 hours prior to surgery. Men may shave face and neck.  Contacts and dentures may not be worn into surgery.  Do not bring valuables to the hospital, including drivers license, insurance or credit cards.  [facility] is not responsible for any belongings or valuables.   TAKE THESE MEDICATIONS THE MORNING OF SURGERY WITH SIP OF WATER AMLODIPINE ZYRTEC  Use CHG Soap  as directed on instruction sheet.  Use  FLONASE on the day of surgery    Stop Metformin  2 days prior to surgery. LAST DOSE Saturday 09/19/2017   Stop Anti-inflammatories (NSAIDS) such as MELOXICAM , Advil, Aleve, Ibuprofen, Motrin, Naproxen, Naprosyn and Aspirin based products such as Excedrin, Goodys Powder, BC Powder. (May take Tylenol or Acetaminophen if needed.)  Stop ANY OVER THE COUNTER supplements until after surgery. (May continue Vitamin D, Vitamin B, and multivitamin.)  Wear comfortable clothing (specific to your surgery type) to the hospital.  Plan for stool softeners for home use.  If you are being admitted to the hospital overnight, leave your suitcase in the car. After surgery it may be brought to your room.  If you are being discharged the day of surgery, you will not be allowed to drive home. You will need a responsible adult to drive you home and stay with you that night.   If you are taking public transportation, you will need to have a responsible adult with you. Please confirm with your physician that it is acceptable to use public transportation.   Please call [phone] if you have any questions about these instructions.

## 2017-09-21 MED ORDER — CHLORHEXIDINE GLUCONATE CLOTH 2 % EX PADS: 6.0000 | MEDICATED_PAD | Freq: Once | CUTANEOUS | Status: DC

## 2017-09-21 MED ORDER — CEFAZOLIN SODIUM-DEXTROSE 2-4 GM/100ML-% IV SOLN: 2.0000 g | INTRAVENOUS | Status: DC

## 2017-09-22 ENCOUNTER — Ambulatory Visit
Admission: RE | Admit: 2017-09-22 | Discharge: 2017-09-22 | Disposition: A | Discharge status: Home / Self Care | Payer: No Typology Code available for payment source | Source: Ambulatory Visit | Attending: Orthopedic Surgery | Admitting: Orthopedic Surgery

## 2017-09-22 ENCOUNTER — Ambulatory Visit: Payer: No Typology Code available for payment source | Admitting: Certified Registered Nurse Anesthetist

## 2017-09-22 ENCOUNTER — Other Ambulatory Visit: Payer: Self-pay

## 2017-09-22 ENCOUNTER — Encounter
Admission: RE | Disposition: A | Discharge status: Home / Self Care | Payer: Self-pay | Source: Ambulatory Visit | Attending: Orthopedic Surgery

## 2017-09-22 DIAGNOSIS — M1990 Unspecified osteoarthritis, unspecified site: Secondary | ICD-10-CM | POA: Diagnosis not present

## 2017-09-22 DIAGNOSIS — E7800 Pure hypercholesterolemia, unspecified: Secondary | ICD-10-CM | POA: Insufficient documentation

## 2017-09-22 DIAGNOSIS — Z7984 Long term (current) use of oral hypoglycemic drugs: Secondary | ICD-10-CM | POA: Insufficient documentation

## 2017-09-22 DIAGNOSIS — Z87891 Personal history of nicotine dependence: Secondary | ICD-10-CM | POA: Diagnosis not present

## 2017-09-22 DIAGNOSIS — Z79899 Other long term (current) drug therapy: Secondary | ICD-10-CM | POA: Diagnosis not present

## 2017-09-22 DIAGNOSIS — Z885 Allergy status to narcotic agent status: Secondary | ICD-10-CM | POA: Diagnosis not present

## 2017-09-22 DIAGNOSIS — Z96653 Presence of artificial knee joint, bilateral: Secondary | ICD-10-CM | POA: Diagnosis not present

## 2017-09-22 DIAGNOSIS — Z888 Allergy status to other drugs, medicaments and biological substances status: Secondary | ICD-10-CM | POA: Diagnosis not present

## 2017-09-22 DIAGNOSIS — M75122 Complete rotator cuff tear or rupture of left shoulder, not specified as traumatic: Secondary | ICD-10-CM | POA: Insufficient documentation

## 2017-09-22 DIAGNOSIS — Z85528 Personal history of other malignant neoplasm of kidney: Secondary | ICD-10-CM | POA: Diagnosis not present

## 2017-09-22 DIAGNOSIS — E119 Type 2 diabetes mellitus without complications: Secondary | ICD-10-CM | POA: Diagnosis not present

## 2017-09-22 DIAGNOSIS — I10 Essential (primary) hypertension: Secondary | ICD-10-CM | POA: Diagnosis not present

## 2017-09-22 DIAGNOSIS — Z86718 Personal history of other venous thrombosis and embolism: Secondary | ICD-10-CM | POA: Insufficient documentation

## 2017-09-22 HISTORY — PX: SHOULDER ARTHROSCOPY WITH OPEN ROTATOR CUFF REPAIR: SHX6092

## 2017-09-22 LAB — GLUCOSE, CAPILLARY
Glucose-Capillary: 224 mg/dL — ABNORMAL HIGH (ref 65–99)
Glucose-Capillary: 254 mg/dL — ABNORMAL HIGH (ref 65–99)

## 2017-09-22 SURGERY — ARTHROSCOPY, SHOULDER WITH REPAIR, ROTATOR CUFF, OPEN
Anesthesia: General | Site: Shoulder | Laterality: Left | Wound class: Clean

## 2017-09-22 MED ORDER — CHLORHEXIDINE GLUCONATE CLOTH 2 % EX PADS: 6.0000 | MEDICATED_PAD | Freq: Once | CUTANEOUS | Status: DC

## 2017-09-22 MED ORDER — ACETAMINOPHEN 10 MG/ML IV SOLN
INTRAVENOUS | Status: AC
  Filled 2017-09-22: qty 100

## 2017-09-22 MED ORDER — FENTANYL CITRATE (PF) 100 MCG/2ML IJ SOLN
INTRAMUSCULAR | Status: AC
  Filled 2017-09-22: qty 2

## 2017-09-22 MED ORDER — LIDOCAINE HCL (PF) 2 % IJ SOLN
INTRAMUSCULAR | Status: AC
  Filled 2017-09-22: qty 10

## 2017-09-22 MED ORDER — SUGAMMADEX SODIUM 200 MG/2ML IV SOLN
INTRAVENOUS | Status: DC | PRN
  Administered 2017-09-22: 300 mg via INTRAVENOUS

## 2017-09-22 MED ORDER — SODIUM CHLORIDE 0.9 % IV SOLN
INTRAVENOUS | Status: DC
  Administered 2017-09-22: 1000 mL via INTRAVENOUS

## 2017-09-22 MED ORDER — ONDANSETRON HCL 4 MG/2ML IJ SOLN
INTRAMUSCULAR | Status: DC | PRN
  Administered 2017-09-22: 4 mg via INTRAVENOUS

## 2017-09-22 MED ORDER — SUCCINYLCHOLINE CHLORIDE 20 MG/ML IJ SOLN
INTRAMUSCULAR | Status: AC
  Filled 2017-09-22: qty 1

## 2017-09-22 MED ORDER — BUPIVACAINE HCL (PF) 0.25 % IJ SOLN
INTRAMUSCULAR | Status: AC
  Filled 2017-09-22: qty 30

## 2017-09-22 MED ORDER — MIDAZOLAM HCL 2 MG/2ML IJ SOLN
2.0000 mg | Freq: Once | INTRAMUSCULAR | Status: AC
  Administered 2017-09-22: 2 mg via INTRAVENOUS

## 2017-09-22 MED ORDER — MIDAZOLAM HCL 2 MG/2ML IJ SOLN
INTRAMUSCULAR | Status: AC
  Filled 2017-09-22: qty 2

## 2017-09-22 MED ORDER — NEOMYCIN-POLYMYXIN B GU 40-200000 IR SOLN
Status: DC | PRN
  Administered 2017-09-22: 2 mL

## 2017-09-22 MED ORDER — ROCURONIUM BROMIDE 50 MG/5ML IV SOLN
INTRAVENOUS | Status: AC
  Filled 2017-09-22: qty 1

## 2017-09-22 MED ORDER — HYDROCODONE-ACETAMINOPHEN 5-325 MG PO TABS
1.0000 | ORAL_TABLET | Freq: Once | ORAL | Status: AC
  Administered 2017-09-22: 1 via ORAL

## 2017-09-22 MED ORDER — CEFAZOLIN SODIUM-DEXTROSE 2-4 GM/100ML-% IV SOLN
2.0000 g | INTRAVENOUS | Status: AC
  Administered 2017-09-22: 2 g via INTRAVENOUS
  Administered 2017-09-22: 1 g via INTRAVENOUS

## 2017-09-22 MED ORDER — ONDANSETRON HCL 4 MG PO TABS: 4.0000 mg | ORAL_TABLET | Freq: Three times a day (TID) | ORAL | 0 refills | Status: DC | PRN

## 2017-09-22 MED ORDER — HYDROCODONE-ACETAMINOPHEN 5-325 MG PO TABS: 1.0000 | ORAL_TABLET | ORAL | 0 refills | Status: DC | PRN

## 2017-09-22 MED ORDER — CEFAZOLIN SODIUM-DEXTROSE 2-4 GM/100ML-% IV SOLN
INTRAVENOUS | Status: AC
  Filled 2017-09-22: qty 100

## 2017-09-22 MED ORDER — FENTANYL CITRATE (PF) 100 MCG/2ML IJ SOLN
INTRAMUSCULAR | Status: AC
  Administered 2017-09-22: 25 ug
  Filled 2017-09-22: qty 2

## 2017-09-22 MED ORDER — LIDOCAINE HCL (PF) 1 % IJ SOLN
INTRAMUSCULAR | Status: DC | PRN
  Administered 2017-09-22: 14 mL

## 2017-09-22 MED ORDER — DEXAMETHASONE SODIUM PHOSPHATE 10 MG/ML IJ SOLN
INTRAMUSCULAR | Status: DC | PRN
  Administered 2017-09-22: 5 mg via INTRAVENOUS

## 2017-09-22 MED ORDER — MIDAZOLAM HCL 2 MG/2ML IJ SOLN
INTRAMUSCULAR | Status: DC | PRN
  Administered 2017-09-22: 2 mg via INTRAVENOUS

## 2017-09-22 MED ORDER — EPINEPHRINE PF 1 MG/ML IJ SOLN
INTRAMUSCULAR | Status: DC | PRN
  Administered 2017-09-22: 7 mL

## 2017-09-22 MED ORDER — FAMOTIDINE 20 MG PO TABS
20.0000 mg | ORAL_TABLET | Freq: Once | ORAL | Status: AC
  Administered 2017-09-22: 20 mg via ORAL

## 2017-09-22 MED ORDER — PROPOFOL 10 MG/ML IV BOLUS
INTRAVENOUS | Status: AC
  Filled 2017-09-22: qty 20

## 2017-09-22 MED ORDER — HYDROCODONE-ACETAMINOPHEN 5-325 MG PO TABS
ORAL_TABLET | ORAL | Status: AC
  Administered 2017-09-22: 1 via ORAL
  Filled 2017-09-22: qty 1

## 2017-09-22 MED ORDER — DEXAMETHASONE SODIUM PHOSPHATE 10 MG/ML IJ SOLN
INTRAMUSCULAR | Status: AC
  Filled 2017-09-22: qty 1

## 2017-09-22 MED ORDER — EPINEPHRINE 30 MG/30ML IJ SOLN
INTRAMUSCULAR | Status: AC
  Filled 2017-09-22: qty 1

## 2017-09-22 MED ORDER — NEOMYCIN-POLYMYXIN B GU 40-200000 IR SOLN
Status: AC
  Filled 2017-09-22: qty 2

## 2017-09-22 MED ORDER — LIDOCAINE HCL (PF) 1 % IJ SOLN
INTRAMUSCULAR | Status: AC
  Filled 2017-09-22: qty 30

## 2017-09-22 MED ORDER — ACETAMINOPHEN 10 MG/ML IV SOLN
INTRAVENOUS | Status: DC | PRN
  Administered 2017-09-22: 1000 mg via INTRAVENOUS

## 2017-09-22 MED ORDER — ONDANSETRON HCL 4 MG/2ML IJ SOLN
INTRAMUSCULAR | Status: AC
  Filled 2017-09-22: qty 2

## 2017-09-22 MED ORDER — SUCCINYLCHOLINE CHLORIDE 20 MG/ML IJ SOLN
INTRAMUSCULAR | Status: DC | PRN
  Administered 2017-09-22: 120 mg via INTRAVENOUS

## 2017-09-22 MED ORDER — LIDOCAINE HCL (CARDIAC) PF 100 MG/5ML IV SOSY
PREFILLED_SYRINGE | INTRAVENOUS | Status: DC | PRN
  Administered 2017-09-22: 100 mg via INTRAVENOUS

## 2017-09-22 MED ORDER — ROCURONIUM BROMIDE 100 MG/10ML IV SOLN
INTRAVENOUS | Status: DC | PRN
  Administered 2017-09-22: 30 mg via INTRAVENOUS
  Administered 2017-09-22 (×2): 20 mg via INTRAVENOUS

## 2017-09-22 MED ORDER — FENTANYL CITRATE (PF) 100 MCG/2ML IJ SOLN
INTRAMUSCULAR | Status: DC | PRN
  Administered 2017-09-22: 100 ug via INTRAVENOUS
  Administered 2017-09-22: 50 ug via INTRAVENOUS

## 2017-09-22 MED ORDER — SEVOFLURANE IN SOLN
RESPIRATORY_TRACT | Status: AC
  Filled 2017-09-22: qty 250

## 2017-09-22 MED ORDER — PHENYLEPHRINE HCL 10 MG/ML IJ SOLN
INTRAMUSCULAR | Status: DC | PRN
  Administered 2017-09-22 (×3): 100 ug via INTRAVENOUS

## 2017-09-22 MED ORDER — PROPOFOL 10 MG/ML IV BOLUS
INTRAVENOUS | Status: DC | PRN
  Administered 2017-09-22: 200 mg via INTRAVENOUS

## 2017-09-22 MED ORDER — GLYCOPYRROLATE 0.2 MG/ML IJ SOLN
INTRAMUSCULAR | Status: DC | PRN
  Administered 2017-09-22: 0.2 mg via INTRAVENOUS

## 2017-09-22 MED ORDER — BUPIVACAINE HCL (PF) 0.25 % IJ SOLN
INTRAMUSCULAR | Status: DC | PRN
  Administered 2017-09-22: 30 mL

## 2017-09-22 MED ORDER — ONDANSETRON HCL 4 MG/2ML IJ SOLN: 4.0000 mg | Freq: Once | INTRAMUSCULAR | Status: DC | PRN

## 2017-09-22 MED ORDER — MIDAZOLAM HCL 2 MG/2ML IJ SOLN
INTRAMUSCULAR | Status: AC
  Administered 2017-09-22: 2 mg via INTRAVENOUS
  Filled 2017-09-22: qty 2

## 2017-09-22 MED ORDER — FAMOTIDINE 20 MG PO TABS
ORAL_TABLET | ORAL | Status: AC
  Administered 2017-09-22: 20 mg via ORAL
  Filled 2017-09-22: qty 1

## 2017-09-22 MED ORDER — FENTANYL CITRATE (PF) 100 MCG/2ML IJ SOLN
25.0000 ug | INTRAMUSCULAR | Status: DC | PRN
  Administered 2017-09-22 (×3): 25 ug via INTRAVENOUS

## 2017-09-22 SURGICAL SUPPLY — 72 items
ADAPTER IRRIG TUBE 2 SPIKE SOL (ADAPTER) ×6 IMPLANT
BUR RADIUS 4.0X18.5 (BURR) ×3 IMPLANT
BUR RADIUS 5.5 (BURR) ×3 IMPLANT
CANNULA 5.75X7 CRYSTAL CLEAR (CANNULA) ×3 IMPLANT
CANNULA PARTIAL THREAD 2X7 (CANNULA) ×3 IMPLANT
CANNULA TWIST IN 8.25X9CM (CANNULA) IMPLANT
CLOSURE WOUND 1/2 X4 (GAUZE/BANDAGES/DRESSINGS) ×2
CONNECTOR PERFECT PASSER (CONNECTOR) IMPLANT
COOLER POLAR GLACIER W/PUMP (MISCELLANEOUS) IMPLANT
CRADLE LAMINECT ARM (MISCELLANEOUS) ×3 IMPLANT
DEVICE SUCT BLK HOLE OR FLOOR (MISCELLANEOUS) IMPLANT
DRAPE IMP U-DRAPE 54X76 (DRAPES) ×6 IMPLANT
DRAPE INCISE IOBAN 66X45 STRL (DRAPES) ×3 IMPLANT
DRAPE SHEET LG 3/4 BI-LAMINATE (DRAPES) ×3 IMPLANT
DRAPE U-SHAPE 47X51 STRL (DRAPES) IMPLANT
DURAPREP 26ML APPLICATOR (WOUND CARE) ×9 IMPLANT
ELECT REM PT RETURN 9FT ADLT (ELECTROSURGICAL) ×3
ELECTRODE REM PT RTRN 9FT ADLT (ELECTROSURGICAL) ×1 IMPLANT
GAUZE PETRO XEROFOAM 1X8 (MISCELLANEOUS) ×3 IMPLANT
GAUZE SPONGE 4X4 12PLY STRL (GAUZE/BANDAGES/DRESSINGS) ×6 IMPLANT
GLOVE BIO SURGEON STRL SZ 6.5 (GLOVE) ×4 IMPLANT
GLOVE BIO SURGEONS STRL SZ 6.5 (GLOVE) ×2
GLOVE BIOGEL PI IND STRL 9 (GLOVE) ×3 IMPLANT
GLOVE BIOGEL PI INDICATOR 9 (GLOVE) ×6
GLOVE INDICATOR 6.5 STRL GRN (GLOVE) ×3 IMPLANT
GLOVE SURG 9.0 ORTHO LTXF (GLOVE) ×6 IMPLANT
GOWN STRL REUS TWL 2XL XL LVL4 (GOWN DISPOSABLE) ×3 IMPLANT
GOWN STRL REUS W/ TWL LRG LVL3 (GOWN DISPOSABLE) ×1 IMPLANT
GOWN STRL REUS W/ TWL LRG LVL4 (GOWN DISPOSABLE) ×1 IMPLANT
GOWN STRL REUS W/TWL LRG LVL3 (GOWN DISPOSABLE) ×2
GOWN STRL REUS W/TWL LRG LVL4 (GOWN DISPOSABLE) ×2
IV LACTATED RINGER IRRG 3000ML (IV SOLUTION) ×14
IV LR IRRIG 3000ML ARTHROMATIC (IV SOLUTION) ×7 IMPLANT
KIT STABILIZATION SHOULDER (MISCELLANEOUS) ×3 IMPLANT
KIT SUTURE 2.8 Q-FIX DISP (MISCELLANEOUS) IMPLANT
KIT SUTURETAK 3.0 INSERT PERC (KITS) IMPLANT
KIT TURNOVER KIT A (KITS) ×3 IMPLANT
MANIFOLD NEPTUNE II (INSTRUMENTS) ×3 IMPLANT
MASK FACE SPIDER DISP (MASK) ×3 IMPLANT
MAT BLUE FLOOR 46X72 FLO (MISCELLANEOUS) ×6 IMPLANT
NDL SAFETY ECLIPSE 18X1.5 (NEEDLE) ×1 IMPLANT
NEEDLE HYPO 18GX1.5 SHARP (NEEDLE) ×2
NEEDLE HYPO 22GX1.5 SAFETY (NEEDLE) ×3 IMPLANT
NS IRRIG 500ML POUR BTL (IV SOLUTION) ×3 IMPLANT
PACK ARTHROSCOPY SHOULDER (MISCELLANEOUS) ×3 IMPLANT
PAD WRAPON POLAR SHDR XLG (MISCELLANEOUS) IMPLANT
PASSER SUT CAPTURE FIRST (SUTURE) IMPLANT
SET TUBE SUCT SHAVER OUTFL 24K (TUBING) ×3 IMPLANT
SET TUBE TIP INTRA-ARTICULAR (MISCELLANEOUS) ×3 IMPLANT
SLING ARM XL TX990206 (SOFTGOODS) ×3 IMPLANT
STRAP SAFETY 5IN WIDE (MISCELLANEOUS) ×3 IMPLANT
STRIP CLOSURE SKIN 1/2X4 (GAUZE/BANDAGES/DRESSINGS) ×4 IMPLANT
SUT ETHILON 4-0 (SUTURE) ×2
SUT ETHILON 4-0 FS2 18XMFL BLK (SUTURE) ×1
SUT LASSO 90 DEG SD STR (SUTURE) IMPLANT
SUT MNCRL 4-0 (SUTURE) ×2
SUT MNCRL 4-0 27XMFL (SUTURE) ×1
SUT PDS AB 0 CT1 27 (SUTURE) ×3 IMPLANT
SUT PERFECTPASSER WHITE CART (SUTURE) IMPLANT
SUT VIC AB 0 CT1 36 (SUTURE) ×3 IMPLANT
SUT VIC AB 2-0 CT2 27 (SUTURE) ×3 IMPLANT
SUTURE ETHLN 4-0 FS2 18XMF BLK (SUTURE) ×1 IMPLANT
SUTURE MAGNUM WIRE 2X48 BLK (SUTURE) IMPLANT
SUTURE MNCRL 4-0 27XMF (SUTURE) ×1 IMPLANT
SYR 10ML LL (SYRINGE) ×3 IMPLANT
SYR 3ML 18GX1 1/2 (SYRINGE) ×3 IMPLANT
TAPE MICROFOAM 4IN (TAPE) ×3 IMPLANT
TUBING ARTHRO INFLOW-ONLY STRL (TUBING) ×3 IMPLANT
TUBING CONNECTING 10 (TUBING) ×2 IMPLANT
TUBING CONNECTING 10' (TUBING) ×1
WAND HAND CNTRL MULTIVAC 90 (MISCELLANEOUS) ×3 IMPLANT
WRAPON POLAR PAD SHDR XLG (MISCELLANEOUS)

## 2017-09-22 NOTE — Anesthesia Preprocedure Evaluation (Addendum)
Anesthesia Evaluation  Patient identified by MRN, date of birth, ID band Patient awake    Reviewed: Allergy & Precautions, H&P , NPO status , Patient's Chart, lab work & pertinent test results, reviewed documented beta blocker date and time   History of Anesthesia Complications (+) DIFFICULT AIRWAY  Airway Mallampati: II  TM Distance: >3 FB Neck ROM: full    Dental  (+) Teeth Intact   Pulmonary neg pulmonary ROS, resolved, former smoker,    Pulmonary exam normal        Cardiovascular Exercise Tolerance: Poor hypertension, On Medications negative cardio ROS Normal cardiovascular exam Rhythm:regular Rate:Normal  + clearance per primary.  JA   Neuro/Psych  Neuromuscular disease negative neurological ROS  negative psych ROS   GI/Hepatic negative GI ROS, Neg liver ROS,   Endo/Other  negative endocrine ROSdiabetes, Well Controlled, Type 2, Oral Hypoglycemic Agents  Renal/GU negative Renal ROS  negative genitourinary   Musculoskeletal   Abdominal   Peds  Hematology negative hematology ROS (+)   Anesthesia Other Findings Past Medical History: 12-26-11: Arthritis     Comment:  arthritis all joints-bursitis in hip 12-26-11: Bronchitis     Comment:  '07- severe case-went home with oxygen for awhile, none               now. Occ. bronchitis now-last spring  No date: Cancer [facility])     Comment:  kidney cancer 12-26-11: Carpal tunnel syndrome     Comment:  bilateral , no surgery yet 12-26-11: Diabetes mellitus     Comment:  oral meds . Diabetic x15 yrs 12-26-11: DVT (deep venous thrombosis) ([place])     Comment:  '74- Rt. lower leg No date: History of kidney stones 12-26-11: History of nuclear stress test     Comment:  '90-[facility]- normal test 12-26-11: Hypercholesterolemia     Comment:  tx. med No date: Hypertension 12-26-11: Urinary frequency     Comment:  hx. Past Surgical History: No date: APPENDECTOMY 12-26-11:  CHOLECYSTECTOMY     Comment:  open 12-26-11 : ECTOPIC PREGNANCY SURGERY     Comment:  '81- with tube removed 12/26/2011: FOOT SURGERY; Left     Comment:  '86- bone spurs 2015: HERNIA REPAIR     Comment:  right lower 12-26-11: JOINT REPLACEMENT     Comment:  '01/rTKA/'04 LTKA No date: KIDNEY SURGERY; Right     Comment:  partial kidney removed 12-26-11: KNEE ARTHROSCOPY     Comment:  bilateral x2 each side, then replacements done 12-26-11: stomach ulcer     Comment:  stress ulcer '70 related to gallbladder surgery 12-26-11: TONSILLECTOMY     Comment:  '72 No date: TUBAL LIGATION BMI    Body Mass Index:  43.42 kg/m     Reproductive/Obstetrics negative OB ROS                            Anesthesia Physical Anesthesia Plan  ASA: III  Anesthesia Plan: General ETT   Post-op Pain Management:    Induction:   PONV Risk Score and Plan:   Airway Management Planned:   Additional Equipment:   Intra-op Plan:   Post-operative Plan:   Informed Consent: I have reviewed the patients History and Physical, chart, labs and discussed the procedure including the risks, benefits and alternatives for the proposed anesthesia with the patient or authorized representative who has indicated his/her understanding and acceptance.   Dental Advisory Given  Plan Discussed with: CRNA  Anesthesia Plan Comments:         Anesthesia Quick Evaluation

## 2017-09-22 NOTE — Anesthesia Post-op Follow-up Note (Signed)
Anesthesia QCDR form completed.        

## 2017-09-22 NOTE — Op Note (Signed)
09/22/2017  9:45 AM  PATIENT:  Grace [name]    PRE-OPERATIVE DIAGNOSIS:  FULL THICKNESS ROTATOR CUFF TEAR LEFT SHOULDER  POST-OPERATIVE DIAGNOSIS:  Same  PROCEDURE:  LEFT SHOULDER ARTHROSCOPIC WITH LYSIS  OF ADHESIONS, DEBRIDEMENT OF ROTATOR CUFF TEAR AND SUBACROMIAL DECOMPRESSION  SURGEON:  Thornton Park, MD  ANESTHESIA:   General  PREOPERATIVE INDICATIONS:  [name] is a  67 y.o. female with a diagnosis of FULL THICKNESS ROTATOR CUFF TEAR LEFT who failed conservative measures and elected for surgical management for persistent left shoulder pain.  Patient saw another specialist about possible reverse total shoulder arthroplasty, however the decision was made to try arthroscopic debridement of the rotator cuff before proceeding with arthroplasty.  I discussed the risks and benefits of surgery. The risks include but are not limited to infection, bleeding requiring blood transfusion, nerve or blood vessel injury, joint stiffness or loss of motion, persistent pain, weakness or instability and the need for further surgery. Medical risks include but are not limited to DVT and pulmonary embolism, myocardial infarction, stroke, pneumonia, respiratory failure and death. Patient understood these risks and wished to proceed.   OPERATIVE FINDINGS: Delaminated tear of the left shoulder rotator cuff with split tear within the radius repair.  OPERATIVE PROCEDURE: Patient was met in the preoperative area.  She was brought to the operating room where she was placed supine on the operative table.  Patient did not have a interscalene block per the anesthesiologist decision.  Patient was positioned in a beachchair position.  All bony prominences were adequately padded.  Patient was prepped and draped in sterile fashion.  A timeout was performed  To confirm the patient's name, date of birth, medical record number, correct site of surgery and correct procedure to be performed.:  Attendance were in  agreement the case began.  The landmarks were drawn out with a surgical marker.  Proposed arthroscopy incision sites were pre-injected with 1% lidocaine plain.  The arthroscope was placed in the posterior portal humeral joint.  A full diagnostic exam was undertaken.  Patient was found to have a tear within the years of previous repair.  There appeared to be an undersurface delamination of the tear as well which had retracted.  She had fraying of the superior labrum.  The biceps tendon was previously released.  Superior labrum was debrided using a 4.0 mm resector shaver blade and 90 degree [employer] wand through an anterior portal.  Subscapularis was intact.  There is no labral tear.  Patient had generalized chondral wear but no focal chondral defect seen in the glenohumeral joint.  The scope was then placed in the subacromial space.  Extensive bursectomy was performed via a lateral port.  Per the patient's request the rotator cuff was then debrided.  Subacromial decompression was performed as well with a 5.5 mm resector shaver blade.  Final images were taken of the debridement and subacromial space.  All arthroscopic instruments were removed.  I was scrubbed and present for the entire case and all sharp and instrument counts were correct at the conclusion the case.  Patient was brought to the PACU in stable condition.  I spoke with her husband postoperatively to let him know the patient was stable in the recovery room in the case had been performed without complication.

## 2017-09-22 NOTE — OR Nursing (Signed)
Patient refused sacral dressing, stated that it took her skin off last time she wore one, she prefers not to have one.

## 2017-09-22 NOTE — OR Nursing (Signed)
Patient repositioned vss

## 2017-09-22 NOTE — Anesthesia Procedure Notes (Signed)
Procedure Name: Intubation Date/Time: 09/22/2017 7:53 AM Performed by: Johnna Acosta, CRNA Pre-anesthesia Checklist: Patient identified, Emergency Drugs available, Suction available, Patient being monitored and Timeout performed Patient Re-evaluated:Patient Re-evaluated prior to induction Oxygen Delivery Method: Circle system utilized Preoxygenation: Pre-oxygenation with 100% oxygen Induction Type: IV induction and Rapid sequence Ventilation: Mask ventilation without difficulty and Oral airway inserted - appropriate to patient size Laryngoscope Size: McGraph and 4 Tube type: Oral Tube size: 7.0 mm Number of attempts: 1 Airway Equipment and Method: Stylet,  Video-laryngoscopy and Oral airway Placement Confirmation: ETT inserted through vocal cords under direct vision,  positive ETCO2 and breath sounds checked- equal and bilateral Secured at: 21 cm Tube secured with: Tape Dental Injury: Teeth and Oropharynx as per pre-operative assessment  Difficulty Due To: Difficulty was anticipated, Difficult Airway- due to large tongue, Difficult Airway- due to limited oral opening and Difficult Airway- due to dentition Future Recommendations: Recommend- induction with short-acting agent, and alternative techniques readily available

## 2017-09-22 NOTE — OR Nursing (Signed)
Patient will move to a recliner chair con't to work with spirometer pt. Appears to be more awake

## 2017-09-22 NOTE — Discharge Instructions (Signed)
  AMBULATORY SURGERY  DISCHARGE INSTRUCTIONS   1) The drugs that you were given will stay in your system until tomorrow so for the next 24 hours you should not:  A) Drive an automobile B) Make any legal decisions C) Drink any alcoholic beverage   2) You may resume regular meals tomorrow.  Today it is better to start with liquids and gradually work up to solid foods.  You may eat anything you prefer, but it is better to start with liquids, then soup and crackers, and gradually work up to solid foods.   3) Please notify your doctor immediately if you have any unusual bleeding, trouble breathing, redness and pain at the surgery site, drainage, fever, or pain not relieved by medication.    4) Additional Instructions: TAKE A STOOL SOFTENER TWICE A DAY WHILE TAKING NARCOTIC PAIN MEDICINE TO PREVENT CONSTIPATION   Please contact your physician with any problems or Same Day Surgery at [phone], Monday through Friday 6 am to 4 pm, or [place] at  Main number at [phone].   

## 2017-09-22 NOTE — Transfer of Care (Signed)
Immediate Anesthesia Transfer of Care Note  Patient: Grace [name]  Procedure(s) Performed: SHOULDER ARTHROSCOPY WITH LYSIS AND RESECTION OF ADHESIONS, AND DEBRIDEMENT (Left Shoulder)  Patient Location: PACU  Anesthesia Type:General  Level of Consciousness: awake and drowsy  Airway & Oxygen Therapy: Patient Spontanous Breathing and Patient connected to face mask oxygen  Post-op Assessment: Report given to RN and Post -op Vital signs reviewed and stable  Post vital signs: Reviewed and stable  Last Vitals:  Vitals Value Taken Time  BP 161/79 09/22/2017  9:59 AM  Temp 36.3 C 09/22/2017  9:55 AM  Pulse 80 09/22/2017  9:58 AM  Resp 24 09/22/2017  9:58 AM  SpO2 95 % 09/22/2017  9:58 AM  Vitals shown include unvalidated device data.  Last Pain:  Vitals:   09/22/17 0955  TempSrc: Temporal  PainSc:          Complications: No apparent anesthesia complications

## 2017-09-22 NOTE — OR Nursing (Signed)
Patient working with the spirometer to keep o2 sats above 92% will con't to monitor

## 2017-09-22 NOTE — H&P (Signed)
The patient has been re-examined, and the chart reviewed, and there have been no interval changes to the documented history and physical.    The risks, benefits, and alternatives have been discussed at length, and the patient is willing to proceed.   

## 2017-09-24 NOTE — Anesthesia Postprocedure Evaluation (Signed)
Anesthesia Post Note  Patient: Grace [name]  Procedure(s) Performed: SHOULDER ARTHROSCOPY WITH LYSIS AND RESECTION OF ADHESIONS, AND DEBRIDEMENT (Left Shoulder)  Patient location during evaluation: PACU Anesthesia Type: General Level of consciousness: awake and alert Pain management: pain level controlled Vital Signs Assessment: post-procedure vital signs reviewed and stable Respiratory status: spontaneous breathing, nonlabored ventilation, respiratory function stable and patient connected to nasal cannula oxygen Cardiovascular status: blood pressure returned to baseline and stable Postop Assessment: no apparent nausea or vomiting Anesthetic complications: no     Last Vitals:  Vitals:   09/22/17 1237 09/22/17 1253  BP: (!) 163/85   Pulse: 76   Resp: 16   Temp:    SpO2: 92% 92%    Last Pain:  Vitals:   09/23/17 0832  TempSrc:   PainSc: 2                  Molli Barrows

## 2018-10-14 ENCOUNTER — Encounter (HOSPITAL_BASED_OUTPATIENT_CLINIC_OR_DEPARTMENT_OTHER): Payer: Self-pay

## 2018-10-14 ENCOUNTER — Ambulatory Visit (HOSPITAL_BASED_OUTPATIENT_CLINIC_OR_DEPARTMENT_OTHER): Admit: 2018-10-14 | Payer: Self-pay | Admitting: Specialist

## 2018-10-14 SURGERY — ARTHROSCOPY, SHOULDER, WITH ROTATOR CUFF REPAIR
Anesthesia: General | Laterality: Left

## 2018-10-29 ENCOUNTER — Other Ambulatory Visit (HOSPITAL_COMMUNITY)
Admission: RE | Admit: 2018-10-29 | Discharge: 2018-10-29 | Disposition: A | Discharge status: Home / Self Care | Payer: Medicare PPO | Source: Ambulatory Visit | Attending: Specialist | Admitting: Specialist

## 2018-10-29 DIAGNOSIS — Z1159 Encounter for screening for other viral diseases: Secondary | ICD-10-CM | POA: Diagnosis present

## 2018-10-30 LAB — SARS CORONAVIRUS 2 (TAT 6-24 HRS): SARS Coronavirus 2: NEGATIVE

## 2018-11-01 ENCOUNTER — Encounter (HOSPITAL_BASED_OUTPATIENT_CLINIC_OR_DEPARTMENT_OTHER): Payer: Self-pay | Admitting: *Deleted

## 2018-11-01 ENCOUNTER — Other Ambulatory Visit: Payer: Self-pay

## 2018-11-01 NOTE — Progress Notes (Signed)
Spoke with patient via telephone for pre op interview. NPO after MN. Patient to take Norvasc in the morning with a sip of water. Patient will need Istat 8 and EKG AM of surgery. Arrival time 66.

## 2018-11-02 ENCOUNTER — Ambulatory Visit (HOSPITAL_BASED_OUTPATIENT_CLINIC_OR_DEPARTMENT_OTHER): Payer: No Typology Code available for payment source | Admitting: Anesthesiology

## 2018-11-02 ENCOUNTER — Ambulatory Visit (HOSPITAL_BASED_OUTPATIENT_CLINIC_OR_DEPARTMENT_OTHER)
Admission: RE | Admit: 2018-11-02 | Discharge: 2018-11-02 | Disposition: A | Discharge status: Home / Self Care | Payer: No Typology Code available for payment source | Attending: Specialist | Admitting: Specialist

## 2018-11-02 ENCOUNTER — Encounter (HOSPITAL_BASED_OUTPATIENT_CLINIC_OR_DEPARTMENT_OTHER): Payer: Self-pay

## 2018-11-02 ENCOUNTER — Other Ambulatory Visit: Payer: Self-pay

## 2018-11-02 ENCOUNTER — Encounter (HOSPITAL_BASED_OUTPATIENT_CLINIC_OR_DEPARTMENT_OTHER)
Admission: RE | Disposition: A | Discharge status: Home / Self Care | Payer: Self-pay | Source: Home / Self Care | Attending: Specialist

## 2018-11-02 DIAGNOSIS — M24012 Loose body in left shoulder: Secondary | ICD-10-CM | POA: Diagnosis not present

## 2018-11-02 DIAGNOSIS — Z791 Long term (current) use of non-steroidal anti-inflammatories (NSAID): Secondary | ICD-10-CM | POA: Insufficient documentation

## 2018-11-02 DIAGNOSIS — G709 Myoneural disorder, unspecified: Secondary | ICD-10-CM | POA: Insufficient documentation

## 2018-11-02 DIAGNOSIS — Z86718 Personal history of other venous thrombosis and embolism: Secondary | ICD-10-CM | POA: Insufficient documentation

## 2018-11-02 DIAGNOSIS — Z79899 Other long term (current) drug therapy: Secondary | ICD-10-CM | POA: Diagnosis not present

## 2018-11-02 DIAGNOSIS — E119 Type 2 diabetes mellitus without complications: Secondary | ICD-10-CM | POA: Insufficient documentation

## 2018-11-02 DIAGNOSIS — G5603 Carpal tunnel syndrome, bilateral upper limbs: Secondary | ICD-10-CM | POA: Diagnosis not present

## 2018-11-02 DIAGNOSIS — Z87442 Personal history of urinary calculi: Secondary | ICD-10-CM | POA: Diagnosis not present

## 2018-11-02 DIAGNOSIS — I10 Essential (primary) hypertension: Secondary | ICD-10-CM | POA: Diagnosis not present

## 2018-11-02 DIAGNOSIS — Z6841 Body Mass Index (BMI) 40.0 and over, adult: Secondary | ICD-10-CM | POA: Insufficient documentation

## 2018-11-02 DIAGNOSIS — I491 Atrial premature depolarization: Secondary | ICD-10-CM | POA: Insufficient documentation

## 2018-11-02 DIAGNOSIS — M1990 Unspecified osteoarthritis, unspecified site: Secondary | ICD-10-CM | POA: Insufficient documentation

## 2018-11-02 DIAGNOSIS — Z888 Allergy status to other drugs, medicaments and biological substances status: Secondary | ICD-10-CM | POA: Diagnosis not present

## 2018-11-02 DIAGNOSIS — Z87891 Personal history of nicotine dependence: Secondary | ICD-10-CM | POA: Insufficient documentation

## 2018-11-02 DIAGNOSIS — Z7984 Long term (current) use of oral hypoglycemic drugs: Secondary | ICD-10-CM | POA: Diagnosis not present

## 2018-11-02 DIAGNOSIS — Z85528 Personal history of other malignant neoplasm of kidney: Secondary | ICD-10-CM | POA: Insufficient documentation

## 2018-11-02 DIAGNOSIS — Z9049 Acquired absence of other specified parts of digestive tract: Secondary | ICD-10-CM | POA: Diagnosis not present

## 2018-11-02 DIAGNOSIS — M19012 Primary osteoarthritis, left shoulder: Secondary | ICD-10-CM | POA: Insufficient documentation

## 2018-11-02 DIAGNOSIS — E7800 Pure hypercholesterolemia, unspecified: Secondary | ICD-10-CM | POA: Diagnosis not present

## 2018-11-02 DIAGNOSIS — Z96652 Presence of left artificial knee joint: Secondary | ICD-10-CM | POA: Diagnosis not present

## 2018-11-02 DIAGNOSIS — M75100 Unspecified rotator cuff tear or rupture of unspecified shoulder, not specified as traumatic: Secondary | ICD-10-CM | POA: Insufficient documentation

## 2018-11-02 DIAGNOSIS — M75102 Unspecified rotator cuff tear or rupture of left shoulder, not specified as traumatic: Secondary | ICD-10-CM | POA: Diagnosis present

## 2018-11-02 HISTORY — PX: SHOULDER ARTHROSCOPY WITH ROTATOR CUFF REPAIR: SHX5685

## 2018-11-02 LAB — POCT I-STAT, CHEM 8
BUN: 14 mg/dL (ref 8–23)
Calcium, Ion: 1.23 mmol/L (ref 1.15–1.40)
Chloride: 102 mmol/L (ref 98–111)
Creatinine, Ser: 0.7 mg/dL (ref 0.44–1.00)
Glucose, Bld: 170 mg/dL — ABNORMAL HIGH (ref 70–99)
HCT: 48 % — ABNORMAL HIGH (ref 36.0–46.0)
Hemoglobin: 16.3 g/dL — ABNORMAL HIGH (ref 12.0–15.0)
Potassium: 4.2 mmol/L (ref 3.5–5.1)
Sodium: 141 mmol/L (ref 135–145)
TCO2: 28 mmol/L (ref 22–32)

## 2018-11-02 LAB — GLUCOSE, CAPILLARY: Glucose-Capillary: 154 mg/dL — ABNORMAL HIGH (ref 70–99)

## 2018-11-02 SURGERY — ARTHROSCOPY, SHOULDER, WITH ROTATOR CUFF REPAIR
Anesthesia: General | Site: Shoulder | Laterality: Left

## 2018-11-02 MED ORDER — MIDAZOLAM HCL 2 MG/2ML IJ SOLN
INTRAMUSCULAR | Status: AC
  Filled 2018-11-02: qty 2

## 2018-11-02 MED ORDER — EPINEPHRINE PF 1 MG/ML IJ SOLN
INTRAMUSCULAR | Status: DC | PRN
  Administered 2018-11-02: 1.5 mg

## 2018-11-02 MED ORDER — MIDAZOLAM HCL 2 MG/2ML IJ SOLN
0.5000 mg | Freq: Once | INTRAMUSCULAR | Status: DC
  Filled 2018-11-02: qty 0.5

## 2018-11-02 MED ORDER — ONDANSETRON HCL 4 MG/2ML IJ SOLN
INTRAMUSCULAR | Status: AC
  Filled 2018-11-02: qty 2

## 2018-11-02 MED ORDER — LACTATED RINGERS IV SOLN
INTRAVENOUS | Status: DC
  Administered 2018-11-02: 50 mL/h via INTRAVENOUS
  Administered 2018-11-02: 13:00:00 via INTRAVENOUS
  Filled 2018-11-02: qty 1000

## 2018-11-02 MED ORDER — LIDOCAINE HCL (CARDIAC) PF 100 MG/5ML IV SOSY
PREFILLED_SYRINGE | INTRAVENOUS | Status: DC | PRN
  Administered 2018-11-02: 60 mg via INTRAVENOUS

## 2018-11-02 MED ORDER — CEFAZOLIN SODIUM-DEXTROSE 1-4 GM/50ML-% IV SOLN
INTRAVENOUS | Status: AC
  Filled 2018-11-02: qty 50

## 2018-11-02 MED ORDER — BUPIVACAINE LIPOSOME 1.3 % IJ SUSP
INTRAMUSCULAR | Status: DC | PRN
  Administered 2018-11-02: 10 mL via PERINEURAL

## 2018-11-02 MED ORDER — FENTANYL CITRATE (PF) 100 MCG/2ML IJ SOLN
INTRAMUSCULAR | Status: DC | PRN
  Administered 2018-11-02: 50 ug via INTRAVENOUS

## 2018-11-02 MED ORDER — POVIDONE-IODINE 7.5 % EX SOLN
Freq: Once | CUTANEOUS | Status: DC
  Filled 2018-11-02: qty 118

## 2018-11-02 MED ORDER — PROPOFOL 10 MG/ML IV BOLUS
INTRAVENOUS | Status: AC
  Filled 2018-11-02: qty 20

## 2018-11-02 MED ORDER — OXYCODONE HCL 5 MG PO TABS
5.0000 mg | ORAL_TABLET | Freq: Once | ORAL | Status: DC | PRN
  Filled 2018-11-02: qty 1

## 2018-11-02 MED ORDER — FENTANYL CITRATE (PF) 100 MCG/2ML IJ SOLN
25.0000 ug | INTRAMUSCULAR | Status: DC | PRN
  Filled 2018-11-02: qty 1

## 2018-11-02 MED ORDER — FENTANYL CITRATE (PF) 100 MCG/2ML IJ SOLN
INTRAMUSCULAR | Status: AC
  Filled 2018-11-02: qty 2

## 2018-11-02 MED ORDER — SODIUM CHLORIDE (PF) 0.9 % IJ SOLN
INTRAMUSCULAR | Status: DC | PRN
  Administered 2018-11-02: 10.5 mL

## 2018-11-02 MED ORDER — OXYCODONE HCL 5 MG/5ML PO SOLN
5.0000 mg | Freq: Once | ORAL | Status: DC | PRN
  Filled 2018-11-02: qty 5

## 2018-11-02 MED ORDER — PROPOFOL 10 MG/ML IV BOLUS
INTRAVENOUS | Status: DC | PRN
  Administered 2018-11-02: 150 mg via INTRAVENOUS

## 2018-11-02 MED ORDER — GLYCOPYRROLATE PF 0.2 MG/ML IJ SOSY
PREFILLED_SYRINGE | INTRAMUSCULAR | Status: AC
  Filled 2018-11-02: qty 1

## 2018-11-02 MED ORDER — GLYCOPYRROLATE 0.2 MG/ML IJ SOLN
INTRAMUSCULAR | Status: DC | PRN
  Administered 2018-11-02: 0.2 mg via INTRAVENOUS

## 2018-11-02 MED ORDER — CEFAZOLIN SODIUM-DEXTROSE 2-4 GM/100ML-% IV SOLN
2.0000 g | INTRAVENOUS | Status: AC
  Administered 2018-11-02: 12:00:00 2 g via INTRAVENOUS
  Filled 2018-11-02: qty 100

## 2018-11-02 MED ORDER — MIDAZOLAM HCL 5 MG/5ML IJ SOLN
INTRAMUSCULAR | Status: DC | PRN
  Administered 2018-11-02: 1 mg via INTRAVENOUS

## 2018-11-02 MED ORDER — EPHEDRINE 5 MG/ML INJ
INTRAVENOUS | Status: AC
  Filled 2018-11-02: qty 10

## 2018-11-02 MED ORDER — FENTANYL CITRATE (PF) 100 MCG/2ML IJ SOLN
50.0000 ug | Freq: Once | INTRAMUSCULAR | Status: AC
  Administered 2018-11-02: 50 ug via INTRAVENOUS
  Filled 2018-11-02: qty 1

## 2018-11-02 MED ORDER — ONDANSETRON HCL 4 MG/2ML IJ SOLN
4.0000 mg | Freq: Four times a day (QID) | INTRAMUSCULAR | Status: DC | PRN
  Filled 2018-11-02: qty 2

## 2018-11-02 MED ORDER — EPHEDRINE SULFATE 50 MG/ML IJ SOLN
INTRAMUSCULAR | Status: DC | PRN
  Administered 2018-11-02: 5 mg via INTRAVENOUS

## 2018-11-02 MED ORDER — FENTANYL CITRATE (PF) 250 MCG/5ML IJ SOLN
INTRAMUSCULAR | Status: AC
  Filled 2018-11-02: qty 5

## 2018-11-02 MED ORDER — SUCCINYLCHOLINE CHLORIDE 20 MG/ML IJ SOLN
INTRAMUSCULAR | Status: DC | PRN
  Administered 2018-11-02: 120 mg via INTRAVENOUS

## 2018-11-02 MED ORDER — BUPIVACAINE HCL (PF) 0.5 % IJ SOLN
INTRAMUSCULAR | Status: DC | PRN
  Administered 2018-11-02: 15 mL via PERINEURAL

## 2018-11-02 MED ORDER — SODIUM CHLORIDE 0.9 % IR SOLN
Status: DC | PRN
  Administered 2018-11-02: 6000 mL
  Administered 2018-11-02: 3000 mL
  Administered 2018-11-02: 6000 mL

## 2018-11-02 MED ORDER — SUCCINYLCHOLINE CHLORIDE 200 MG/10ML IV SOSY
PREFILLED_SYRINGE | INTRAVENOUS | Status: AC
  Filled 2018-11-02: qty 10

## 2018-11-02 MED ORDER — MIDAZOLAM HCL 2 MG/2ML IJ SOLN
1.0000 mg | Freq: Once | INTRAMUSCULAR | Status: AC
  Administered 2018-11-02: 1 mg via INTRAVENOUS
  Filled 2018-11-02: qty 1

## 2018-11-02 MED ORDER — LIDOCAINE 2% (20 MG/ML) 5 ML SYRINGE
INTRAMUSCULAR | Status: AC
  Filled 2018-11-02: qty 5

## 2018-11-02 MED ORDER — ONDANSETRON HCL 4 MG/2ML IJ SOLN
INTRAMUSCULAR | Status: DC | PRN
  Administered 2018-11-02: 4 mg via INTRAVENOUS

## 2018-11-02 SURGICAL SUPPLY — 84 items
ANCHOR PEEK 4.75X19.1 SWLK C (Anchor) ×2 IMPLANT
BLADE EXCALIBUR 4.0X13 (MISCELLANEOUS) ×2 IMPLANT
BLADE EXCALIBUR 5.0X13 (MISCELLANEOUS) ×1 IMPLANT
BLADE SURG 11 STRL SS (BLADE) ×2 IMPLANT
BLADE SURG 15 STRL LF DISP TIS (BLADE) ×1 IMPLANT
BLADE SURG 15 STRL SS (BLADE) ×1
BURR CLEARCUT OVAL 5.5X13 (MISCELLANEOUS) ×1 IMPLANT
BURR OVAL 12 FL 5.5X13 (MISCELLANEOUS) ×1
BURR OVAL 8 FLU 5.0X13 (MISCELLANEOUS) ×2 IMPLANT
CANNULA 5.75X7 CRYSTAL CLEAR (CANNULA) IMPLANT
CANNULA 5.75X71 LONG (CANNULA) IMPLANT
CANNULA TWIST IN 8.25X7CM (CANNULA) ×2 IMPLANT
CONNECTOR 5 IN 1 STRAIGHT STRL (MISCELLANEOUS) ×2 IMPLANT
COVER WAND RF STERILE (DRAPES) ×2 IMPLANT
DECANTER SPIKE VIAL GLASS SM (MISCELLANEOUS) ×2 IMPLANT
DRAPE INCISE IOBAN 66X45 STRL (DRAPES) ×2 IMPLANT
DRAPE ORTHO SPLIT 77X108 STRL (DRAPES) ×2
DRAPE POUCH INSTRU U-SHP 10X18 (DRAPES) ×2 IMPLANT
DRAPE SHEET LG 3/4 BI-LAMINATE (DRAPES) ×2 IMPLANT
DRAPE STERI 35X30 U-POUCH (DRAPES) ×2 IMPLANT
DRAPE SURG 17X23 STRL (DRAPES) ×2 IMPLANT
DRAPE SURG ORHT 6 SPLT 77X108 (DRAPES) ×2 IMPLANT
DRAPE U-SHAPE 47X51 STRL (DRAPES) ×2 IMPLANT
DURAPREP 26ML APPLICATOR (WOUND CARE) ×2 IMPLANT
DW OUTFLOW CASSETTE/TUBE SET (MISCELLANEOUS) ×2 IMPLANT
ELECT MENISCUS 165MM 90D (ELECTRODE) IMPLANT
ELECT REM PT RETURN 9FT ADLT (ELECTROSURGICAL) ×2
ELECTRODE REM PT RTRN 9FT ADLT (ELECTROSURGICAL) ×1 IMPLANT
EXCALIBUR 3.8MM X 13CM (MISCELLANEOUS) ×2 IMPLANT
FIBER TAPE 2MM (SUTURE) ×2 IMPLANT
FIBERSTICK 2 (SUTURE) IMPLANT
GAUZE SPONGE 4X4 12PLY STRL (GAUZE/BANDAGES/DRESSINGS) ×2 IMPLANT
GAUZE XEROFORM 1X8 LF (GAUZE/BANDAGES/DRESSINGS) ×2 IMPLANT
GLOVE BIO SURGEON STRL SZ 6.5 (GLOVE) ×1 IMPLANT
GLOVE BIO SURGEON STRL SZ8 (GLOVE) ×3 IMPLANT
GLOVE BIOGEL PI IND STRL 6.5 (GLOVE) IMPLANT
GLOVE BIOGEL PI INDICATOR 6.5 (GLOVE) ×1
GLOVE INDICATOR 8.0 STRL GRN (GLOVE) ×3 IMPLANT
GOWN STRL REUS W/ TWL LRG LVL3 (GOWN DISPOSABLE) IMPLANT
GOWN STRL REUS W/TWL LRG LVL3 (GOWN DISPOSABLE) ×1
GOWN STRL REUS W/TWL XL LVL3 (GOWN DISPOSABLE) ×4 IMPLANT
KIT TURNOVER CYSTO (KITS) ×2 IMPLANT
LASSO SUT 90 DEGREE (SUTURE) IMPLANT
MANIFOLD NEPTUNE II (INSTRUMENTS) ×2 IMPLANT
NDL 1/2 CIR CATGUT .05X1.09 (NEEDLE) IMPLANT
NDL SCORPION MULTI FIRE (NEEDLE) IMPLANT
NEEDLE 1/2 CIR CATGUT .05X1.09 (NEEDLE) IMPLANT
NEEDLE HYPO 22GX1.5 SAFETY (NEEDLE) ×2 IMPLANT
NEEDLE SCORPION MULTI FIRE (NEEDLE) ×2 IMPLANT
NS IRRIG 500ML POUR BTL (IV SOLUTION) IMPLANT
PACK ARTHROSCOPY DSU (CUSTOM PROCEDURE TRAY) ×2 IMPLANT
PACK BASIN DAY SURGERY FS (CUSTOM PROCEDURE TRAY) ×2 IMPLANT
PAD ABD 8X10 STRL (GAUZE/BANDAGES/DRESSINGS) ×2 IMPLANT
PAD ARMBOARD 7.5X6 YLW CONV (MISCELLANEOUS) IMPLANT
PENCIL BUTTON HOLSTER BLD 10FT (ELECTRODE) IMPLANT
PORT APPOLLO RF 90DEGREE MULTI (SURGICAL WAND) ×2 IMPLANT
SLEEVE ARM SUSPENSION SYSTEM (MISCELLANEOUS) ×2 IMPLANT
SLING S3 LATERAL DISP (MISCELLANEOUS) IMPLANT
SLING ULTRA II L (ORTHOPEDIC SUPPLIES) ×2 IMPLANT
SLING ULTRA II S (ORTHOPEDIC SUPPLIES) ×1 IMPLANT
SPONGE LAP 4X18 RFD (DISPOSABLE) IMPLANT
SUT 2 FIBERLOOP 20 STRT BLUE (SUTURE)
SUT ETHILON 3 0 PS 1 (SUTURE) ×3 IMPLANT
SUT FIBERWIRE #2 38 T-5 BLUE (SUTURE)
SUT LASSO 45 DEGREE LEFT (SUTURE) IMPLANT
SUT LASSO 45D RIGHT (SUTURE) IMPLANT
SUT PDS AB 0 CT1 36 (SUTURE) IMPLANT
SUT TIGER TAPE 7 IN WHITE (SUTURE) ×1 IMPLANT
SUT VIC AB 0 CT1 36 (SUTURE) IMPLANT
SUT VIC AB 2-0 CT1 27 (SUTURE)
SUT VIC AB 2-0 CT1 TAPERPNT 27 (SUTURE) IMPLANT
SUTURE 2 FIBERLOOP 20 STRT BLU (SUTURE) IMPLANT
SUTURE FIBERWR #2 38 T-5 BLUE (SUTURE) IMPLANT
SUTURE TAPE 1.3 40 TPR END (SUTURE) ×2 IMPLANT
SUTURETAPE 1.3 40 TPR END (SUTURE)
SYR CONTROL 10ML LL (SYRINGE) ×2 IMPLANT
SYR TB 1ML 27GX1/2 SAFE (SYRINGE) ×1 IMPLANT
SYR TB 1ML 27GX1/2 SAFETY (SYRINGE) ×1
TAPE FIBER 2MM 7IN #2 BLUE (SUTURE) IMPLANT
TOWEL OR 17X26 10 PK STRL BLUE (TOWEL DISPOSABLE) ×2 IMPLANT
TUBE CONNECTING 12X1/4 (SUCTIONS) ×2 IMPLANT
TUBING ARTHROSCOPY IRRIG 16FT (MISCELLANEOUS) ×2 IMPLANT
TUBING REDEUCE PUMP W/CON 8IN (MISCELLANEOUS) ×2 IMPLANT
WATER STERILE IRR 500ML POUR (IV SOLUTION) ×2 IMPLANT

## 2018-11-02 NOTE — Op Note (Signed)
Dictated#`[id number]

## 2018-11-02 NOTE — Discharge Instructions (Signed)
Post Anesthesia Home Care Instructions  Activity: Get plenty of rest for the remainder of the day. A responsible individual must stay with you for 24 hours following the procedure.  For the next 24 hours, DO NOT: -Drive a car -Operate machinery -Drink alcoholic beverages -Take any medication unless instructed by your physician -Make any legal decisions or sign important papers.  Meals: Start with liquid foods such as gelatin or soup. Progress to regular foods as tolerated. Avoid greasy, spicy, heavy foods. If nausea and/or vomiting occur, drink only clear liquids until the nausea and/or vomiting subsides. Call your physician if vomiting continues.  Special Instructions/Symptoms: Your throat may feel dry or sore from the anesthesia or the breathing tube placed in your throat during surgery. If this causes discomfort, gargle with warm salt water. The discomfort should disappear within 24 hours.  If you had a scopolamine patch placed behind your ear for the management of post- operative nausea and/or vomiting:  1. The medication in the patch is effective for 72 hours, after which it should be removed.  Wrap patch in a tissue and discard in the trash. Wash hands thoroughly with soap and water. 2. You may remove the patch earlier than 72 hours if you experience unpleasant side effects which may include dry mouth, dizziness or visual disturbances. 3. Avoid touching the patch. Wash your hands with soap and water after contact with the patch.      Regional Anesthesia Blocks  1. Numbness or the inability to move the "blocked" extremity may last from 3-48 hours after placement. The length of time depends on the medication injected and your individual response to the medication. If the numbness is not going away after 48 hours, call your surgeon.  2. The extremity that is blocked will need to be protected until the numbness is gone and the  Strength has returned. Because you cannot feel it, you  will need to take extra care to avoid injury. Because it may be weak, you may have difficulty moving it or using it. You may not know what position it is in without looking at it while the block is in effect.  3. For blocks in the legs and feet, returning to weight bearing and walking needs to be done carefully. You will need to wait until the numbness is entirely gone and the strength has returned. You should be able to move your leg and foot normally before you try and bear weight or walk. You will need someone to be with you when you first try to ensure you do not fall and possibly risk injury.  4. Bruising and tenderness at the needle site are common side effects and will resolve in a few days.  5. Persistent numbness or new problems with movement should be communicated to the surgeon or the [facility] ([phone])/ [facility] ([phone]).  Information for Discharge Teaching: EXPAREL (bupivacaine liposome injectable suspension)   Your surgeon or anesthesiologist gave you EXPAREL(bupivacaine) to help control your pain after surgery.   EXPAREL is a local anesthetic that provides pain relief by numbing the tissue around the surgical site.  EXPAREL is designed to release pain medication over time and can control pain for up to 72 hours.  Depending on how you respond to EXPAREL, you may require less pain medication during your recovery.  Possible side effects:  Temporary loss of sensation or ability to move in the area where bupivacaine was injected.  Nausea, vomiting, constipation  Rarely,   numbness and tingling in your mouth or lips, lightheadedness, or anxiety may occur.  Call your doctor right away if you think you may be experiencing any of these sensations, or if you have other questions regarding possible side effects.  Follow all other discharge instructions given to you by your surgeon or nurse. Eat a healthy diet and drink plenty of water or other  fluids.  If you return to the hospital for any reason within 96 hours following the administration of EXPAREL, it is important for health care providers to know that you have received this anesthetic. A teal colored band has been placed on your arm with the date, time and amount of EXPAREL you have received in order to alert and inform your health care providers. Please leave this armband in place for the full 96 hours following administration, and then you may remove the band. 

## 2018-11-02 NOTE — H&P (Signed)
Grace [name] is an 68 y.o. female.  Mild left shoulder hurts chief Complaint:  HPI: 68 year old female status post right shoulder rotator cuff repair in the past at this point time she has persistent pain dysfunction of the right shoulder despite extensive conservative treatment.  MRI scan was consistent with a another recurrent tear large massive versus a repairable.  Today she is scheduled for arthroscopic evaluation and determination the status of the rotator cuff and repair if if possible if not he should undergo a superior capsular reconstruction.  All questions were encouraged and answered patient preoperatively she understood and wished to proceed.  Past Medical History:  Diagnosis Date  . Arthritis 12-26-11   arthritis all joints-bursitis in hip  . Bronchitis 12-26-11   '07- severe case-went home with oxygen for awhile, none now. Occ. bronchitis now-last spring   . Cancer [facility])    kidney cancer  . Carpal tunnel syndrome 12-26-11   bilateral , no surgery yet  . Diabetes mellitus 12-26-11   oral meds . Diabetic x15 yrs  . DVT (deep venous thrombosis) ([place]) 12-26-11   '74- Rt. lower leg  . History of kidney stones   . History of nuclear stress test 12-26-11   '90-[facility]- normal test  . Hypercholesterolemia 12-26-11   tx. med  . Hypertension   . Urinary frequency 12-26-11   hx.    Past Surgical History:  Procedure Laterality Date  . APPENDECTOMY    . CHOLECYSTECTOMY  12-26-11   open  . ECTOPIC PREGNANCY SURGERY  12-26-11    '81- with tube removed  . FOOT SURGERY Left 12/26/2011   '86- bone spurs  . HERNIA REPAIR  2015   right lower  . JOINT REPLACEMENT  12-26-11   '01/rTKA/'04 LTKA  . KIDNEY SURGERY Right    partial kidney removed  . KNEE ARTHROSCOPY  12-26-11   bilateral x2 each side, then replacements done  . SHOULDER ARTHROSCOPY WITH OPEN ROTATOR CUFF REPAIR Left 09/22/2017   Procedure: SHOULDER ARTHROSCOPY WITH LYSIS AND RESECTION OF ADHESIONS, AND DEBRIDEMENT;  Surgeon:  Thornton Park, MD;  Location: [facility] ORS;  Service: Orthopedics;  Laterality: Left;  . stomach ulcer  12-26-11   stress ulcer '70 related to gallbladder surgery  . TONSILLECTOMY  12-26-11   '72  . TUBAL LIGATION      History reviewed. No pertinent family history. Social History:  reports that she quit smoking about 32 years ago. She smoked 1.50 packs per day. She has never used smokeless tobacco. She reports that she does not drink alcohol or use drugs.  Allergies:  Allergies  Allergen Reactions  . Compazine [Prochlorperazine Edisylate]     Extreme Muscle rigidity  . Tramadol Itching    Medications Prior to Admission  Medication Sig Dispense Refill  . amLODipine (NORVASC) 5 MG tablet Take 5 mg by mouth daily.  0  . cetirizine (ZYRTEC) 10 MG tablet Take 10 mg by mouth daily.    . diphenhydrAMINE (BENADRYL) 25 mg capsule Take 25 mg by mouth 2 (two) times daily as needed for itching.     . fluticasone (FLONASE) 50 MCG/ACT nasal spray Place 2 sprays into the nose See admin instructions. Use 2 sprays in each nostril each morning, may repeat dose in the evening if having sinus issues.    . furosemide (LASIX) 40 MG tablet Take 40 mg by mouth daily.     Marland Kitchen glipiZIDE (GLUCOTROL XL) 5 MG 24 hr tablet Take 5 mg by mouth 2 (two) times  daily.    . ibuprofen (ADVIL,MOTRIN) 200 MG tablet Take 600 mg by mouth every 8 (eight) hours as needed (for pain.).    Marland Kitchen lisinopril (PRINIVIL,ZESTRIL) 20 MG tablet Take 60 mg by mouth daily.    . metFORMIN (GLUCOPHAGE) 1000 MG tablet Take 1,000 mg by mouth 2 (two) times daily with a meal.    . Multiple Vitamin (MULTIVITAMIN WITH MINERALS) TABS tablet Take 1 tablet by mouth daily.    . potassium chloride SA (K-DUR,KLOR-CON) 20 MEQ tablet Take 10 mEq by mouth daily.     . pravastatin (PRAVACHOL) 20 MG tablet Take 20 mg by mouth at bedtime.      No results found for this or any previous visit (from the past 48 hour(s)). No results found.  Review of Systems  All  other systems reviewed and are negative.   Blood pressure (!) 205/91, pulse 66, temperature 97.7 F (36.5 C), temperature source Oral, resp. rate 18, height 5' 8.5" (1.74 m), weight 127.5 kg, SpO2 97 %. Physical Exam  Constitutional: She is oriented to person, place, and time. She appears well-developed and well-nourished.  HENT:  Head: Normocephalic and atraumatic.  Eyes: Pupils are equal, round, and reactive to light. Conjunctivae and EOM are normal.  Neck: Normal range of motion. Neck supple.  Cardiovascular: Normal rate and regular rhythm.  Respiratory: Effort normal and breath sounds normal.  GI: Soft. Bowel sounds are normal.  Neurological: She is alert and oriented to person, place, and time. She has normal reflexes.  Skin: Skin is warm and dry.  Psychiatric: She has a normal mood and affect. Her behavior is normal. Judgment and thought content normal.     Assessment/Plan 68 year old female with recurrent right shoulder pain despite extensive conservative treatment all stages are consistent with recurrent rotator cuff tear she is here for evaluation and treatment as indicated. Cynda Familia, MD 11/02/2018, 9:57 AM

## 2018-11-02 NOTE — Transfer of Care (Signed)
Immediate Anesthesia Transfer of Care Note  Patient: Grace [name]  Procedure(s) Performed: SHOULDER ARTHROSCOPY debridement subacromial decompression distal clavicle resection, removal of loose bodies (Left Shoulder)  Patient Location: PACU  Anesthesia Type:General  Level of Consciousness: awake, alert  and oriented  Airway & Oxygen Therapy: Patient Spontanous Breathing and Patient connected to nasal cannula oxygen  Post-op Assessment: Report given to RN and Post -op Vital signs reviewed and stable  Post vital signs: Reviewed and stable  Last Vitals:  Vitals Value Taken Time  BP 148/80 11/02/18 1325  Temp    Pulse 95 11/02/18 1327  Resp 17 11/02/18 1327  SpO2 95 % 11/02/18 1327  Vitals shown include unvalidated device data.  Last Pain:  Vitals:   11/02/18 0955  TempSrc:   PainSc: 6       Patients Stated Pain Goal: 4 (93/71/69 6789)  Complications: No apparent anesthesia complications

## 2018-11-02 NOTE — Progress Notes (Signed)
Assisted Dr. Hodierne with left, ultrasound guided, interscalene  block. Side rails up, monitors on throughout procedure. See vital signs in flow sheet. Tolerated Procedure well. 

## 2018-11-02 NOTE — Op Note (Signed)
NAMESOLA, MARGOLIS [facility] MEDICAL RECORD QQ:[id number] ACCOUNT [id number] DATE OF BIRTH:1950/06/15 FACILITY: [facility] LOCATION: [facility] PHYSICIAN:Female Iafrate Gwinda Passe, MD  OPERATIVE REPORT  DATE OF PROCEDURE:  11/02/2018  PREOPERATIVE DIAGNOSES: 1.  Left shoulder probable rotator cuff tear, unknown biceps status. 2.  Acromioclavicular arthritis.  POSTOPERATIVE DIAGNOSES: 1.  Left shoulder recurrent medium-sized rotator cuff tear. 2.  Absent biceps tendon. 3.  Multiple chondral loose bodies. 4.  Acromioclavicular arthritis. 5.  Mild glenohumeral arthrosis.  PROCEDURE: 1.  Left shoulder glenohumeral arthroscopy, removal of multiple chondral loose bodies and old suture. 2.  Arthroscopic distal clavicle resection operative procedure. 3.  Arthroscopic rotator cuff mobilization and repair utilizing suture bridge technique.  SURGEON:  Cynda Familia, MD  ASSISTANT:  Narda Amber, PA-C   ANESTHESIA:  Interscalene block, general.  ESTIMATED BLOOD LOSS:  Minimal.  DRAINS:  None.  COMPLICATIONS:  None.  DISPOSITION:  PACU, stable.  DESCRIPTION OF PROCEDURE:  The patient was counseled in the holding area, correct site was identified, marked and signed appropriately.  IV started, sedation given, block administered, IV antibiotics were given.  Taken to the operating room and placed in  supine position, general anesthesia, turned to a right lateral decubitus position, well-padded bump.  PAS stockings were applied for DVT prophylaxis.  Left shoulder examined for range of motion, stable.  Prepped with DuraPrep and draped in sterile  fashion utilizing the Arthrex sterile shoulder holder at 30 degrees of abduction, 10 degrees forward flexion and 20 pounds longitudinal traction based on the patient's weight of arm to make sure we did not over distract her upper extremity.  After  timeout, a posterior portal was created and arthroscope was placed into the glenohumeral joint.  Diagnostic  arthroscopy revealed multiple chondral loose bodies, subacromial and intraarticular.  Lateral portal was established.  Neurovascular structures  were protected.  Axillary nerve and the chondral loose bodies were sequentially removed with graspers and a shaver.  There was mild glenohumeral arthrosis, but by no means end stage.  Subscap was intact.  The biceps tendon intraarticular portion was  absent.  The rotator cuff was then extensively visualized.  She had a recurrent tear of the supraspinatus and infraspinatus junction.  The rotator cuff was mobilized both intraarticular and extraarticular and found to be very satisfactory for repair.   Rotator cuff soft tissue removed from the greater tuberosity, and a slight tuberoplasty was performed.  Following mobilization of the cuff, accessory anterior portal was made ____ and distal clavicle was removed 5-6 mm, leaving the superior capsule  intact.  Clavicles were palpated and ____ was stable to remove.  Hemostasis was obtained.  Attention directed back to the rotator cuff.  A small puncture was made superiorly.  An Arthrex PEEK anchor was placed in the greater tuberosity.  Four limbs of  suture were placed in the rotator cuff, all FiberTapes.  The arm was abducted and sent a set of sutures to another SwiveLock for suture bridge double-row technique.  At this point in time, we then placed the safety suture from the anchor into the most  posterior aspect of the cuff tissue and tied it down nicely to reinforce the repair.  At this point in time, I felt we had an excellent repair of rotator cuff down to bleeding tissue.  The cuff was satisfactory quality and was well mobilized but not over  lateralized.  No other abnormalities were noted.  The endoscope was removed and taken out of traction.  Portals closed with nylon sutures.  Sterile dressing applied, placed in a shoulder abduction sling, awakened and taken from the operating room to the  PACU in stable condition.   She was stabilized in PACU and discharged home.  She will see [place] in the office in a couple of weeks.  Start physical therapy next week.  Prognosis is good.  Helped with patient positioning, prepping and draping, technical and surgical assistance throughout the entire case, wound closure, application of dressing, sling, Narda Amber, PA-C's   assistance was needed.  LN/NUANCE  D:11/02/2018 T:11/02/2018 JOB:[id number]

## 2018-11-02 NOTE — Anesthesia Procedure Notes (Signed)
Procedure Name: Intubation Date/Time: 11/02/2018 11:40 AM Performed by: Bufford Spikes, CRNA Pre-anesthesia Checklist: Patient identified, Emergency Drugs available, Suction available and Patient being monitored Patient Re-evaluated:Patient Re-evaluated prior to induction Oxygen Delivery Method: Circle system utilized Preoxygenation: Pre-oxygenation with 100% oxygen Induction Type: IV induction Ventilation: Mask ventilation without difficulty Laryngoscope Size: Miller and 2 Grade View: Grade II Tube type: Oral Tube size: 7.0 mm Number of attempts: 1 Airway Equipment and Method: Stylet and Oral airway Placement Confirmation: ETT inserted through vocal cords under direct vision,  positive ETCO2 and breath sounds checked- equal and bilateral Secured at: 20 cm Tube secured with: Tape Dental Injury: Teeth and Oropharynx as per pre-operative assessment

## 2018-11-02 NOTE — Anesthesia Preprocedure Evaluation (Signed)
Anesthesia Evaluation  Patient identified by MRN, date of birth, ID band Patient awake    Reviewed: Allergy & Precautions, H&P , NPO status , Patient's Chart, lab work & pertinent test results  Airway Mallampati: II   Neck ROM: full    Dental   Pulmonary former smoker,    breath sounds clear to auscultation       Cardiovascular hypertension,  Rhythm:regular Rate:Normal     Neuro/Psych  Neuromuscular disease    GI/Hepatic   Endo/Other  diabetes, Type 2Morbid obesity  Renal/GU      Musculoskeletal  (+) Arthritis ,   Abdominal   Peds  Hematology   Anesthesia Other Findings   Reproductive/Obstetrics                             Anesthesia Physical Anesthesia Plan  ASA: III  Anesthesia Plan: General   Post-op Pain Management:  Regional for Post-op pain   Induction: Intravenous  PONV Risk Score and Plan: 3 and Ondansetron, Dexamethasone, Midazolam and Treatment may vary due to age or medical condition  Airway Management Planned: Oral ETT  Additional Equipment:   Intra-op Plan:   Post-operative Plan: Extubation in OR  Informed Consent: I have reviewed the patients History and Physical, chart, labs and discussed the procedure including the risks, benefits and alternatives for the proposed anesthesia with the patient or authorized representative who has indicated his/her understanding and acceptance.       Plan Discussed with: CRNA, Anesthesiologist and Surgeon  Anesthesia Plan Comments:         Anesthesia Quick Evaluation

## 2018-11-02 NOTE — Anesthesia Procedure Notes (Signed)
Anesthesia Regional Block: Interscalene brachial plexus block   Pre-Anesthetic Checklist: ,, timeout performed, Correct Patient, Correct Site, Correct Laterality, Correct Procedure, Correct Position, site marked, Risks and benefits discussed,  Surgical consent,  Pre-op evaluation,  At surgeon's request and post-op pain management  Laterality: Left  Prep: chloraprep       Needles:  Injection technique: Single-shot  Needle Type: Echogenic Stimulator Needle     Needle Length: 5cm  Needle Gauge: 22     Additional Needles:   Procedures:, nerve stimulator,,,,,,,   Nerve Stimulator or Paresthesia:  Response: biceps flexion, 0.45 mA,   Additional Responses:   Narrative:  Start time: 11/02/2018 10:51 AM End time: 11/02/2018 10:57 AM Injection made incrementally with aspirations every 5 mL.  Performed by: Personally  Anesthesiologist: Albertha Ghee, MD  Additional Notes: Functioning IV was confirmed and monitors were applied.  A 22mm 22ga Arrow echogenic stimulator needle was used. Sterile prep and drape,hand hygiene and sterile gloves were used.  Negative aspiration and negative test dose prior to incremental administration of local anesthetic. The patient tolerated the procedure well.  Ultrasound guidance: relevent anatomy identified, needle position confirmed, local anesthetic spread visualized around nerve(s), vascular puncture avoided.  Image printed for medical record.

## 2018-11-03 ENCOUNTER — Encounter (HOSPITAL_BASED_OUTPATIENT_CLINIC_OR_DEPARTMENT_OTHER): Payer: Self-pay | Admitting: Specialist

## 2018-11-03 NOTE — Anesthesia Postprocedure Evaluation (Signed)
Anesthesia Post Note  Patient: Grace [name]  Procedure(s) Performed: SHOULDER ARTHROSCOPY debridement subacromial decompression distal clavicle resection, removal of loose bodies (Left Shoulder)     Patient location during evaluation: PACU Anesthesia Type: General Level of consciousness: awake and alert Pain management: pain level controlled Vital Signs Assessment: post-procedure vital signs reviewed and stable Respiratory status: spontaneous breathing, nonlabored ventilation, respiratory function stable and patient connected to nasal cannula oxygen Cardiovascular status: blood pressure returned to baseline and stable Postop Assessment: no apparent nausea or vomiting Anesthetic complications: no    Last Vitals:  Vitals:   11/02/18 1400 11/02/18 1500  BP: (!) 151/75 (!) 150/79  Pulse: 86 96  Resp: 17 18  Temp:  36.7 C  SpO2: 93% 92%    Last Pain:  Vitals:   11/03/18 1152  TempSrc:   PainSc: 0-No pain                 Shakima Nisley S

## 2019-03-23 IMAGING — CR DG HIP (WITH OR WITHOUT PELVIS) 2-3V*R*
1 series · 3 of 3 positions shown · non-contrast
Comparison: None.

CLINICAL DATA: Bilateral hip pain

EXAM:
DG HIP (WITH OR WITHOUT PELVIS) 2-3V RIGHT

[Series 1: dg hip unilat w or w/o pelvis 2-3 views  · non-contrast · 0.14mm/px · 3 of 3 slices shown]
[im 1/3]
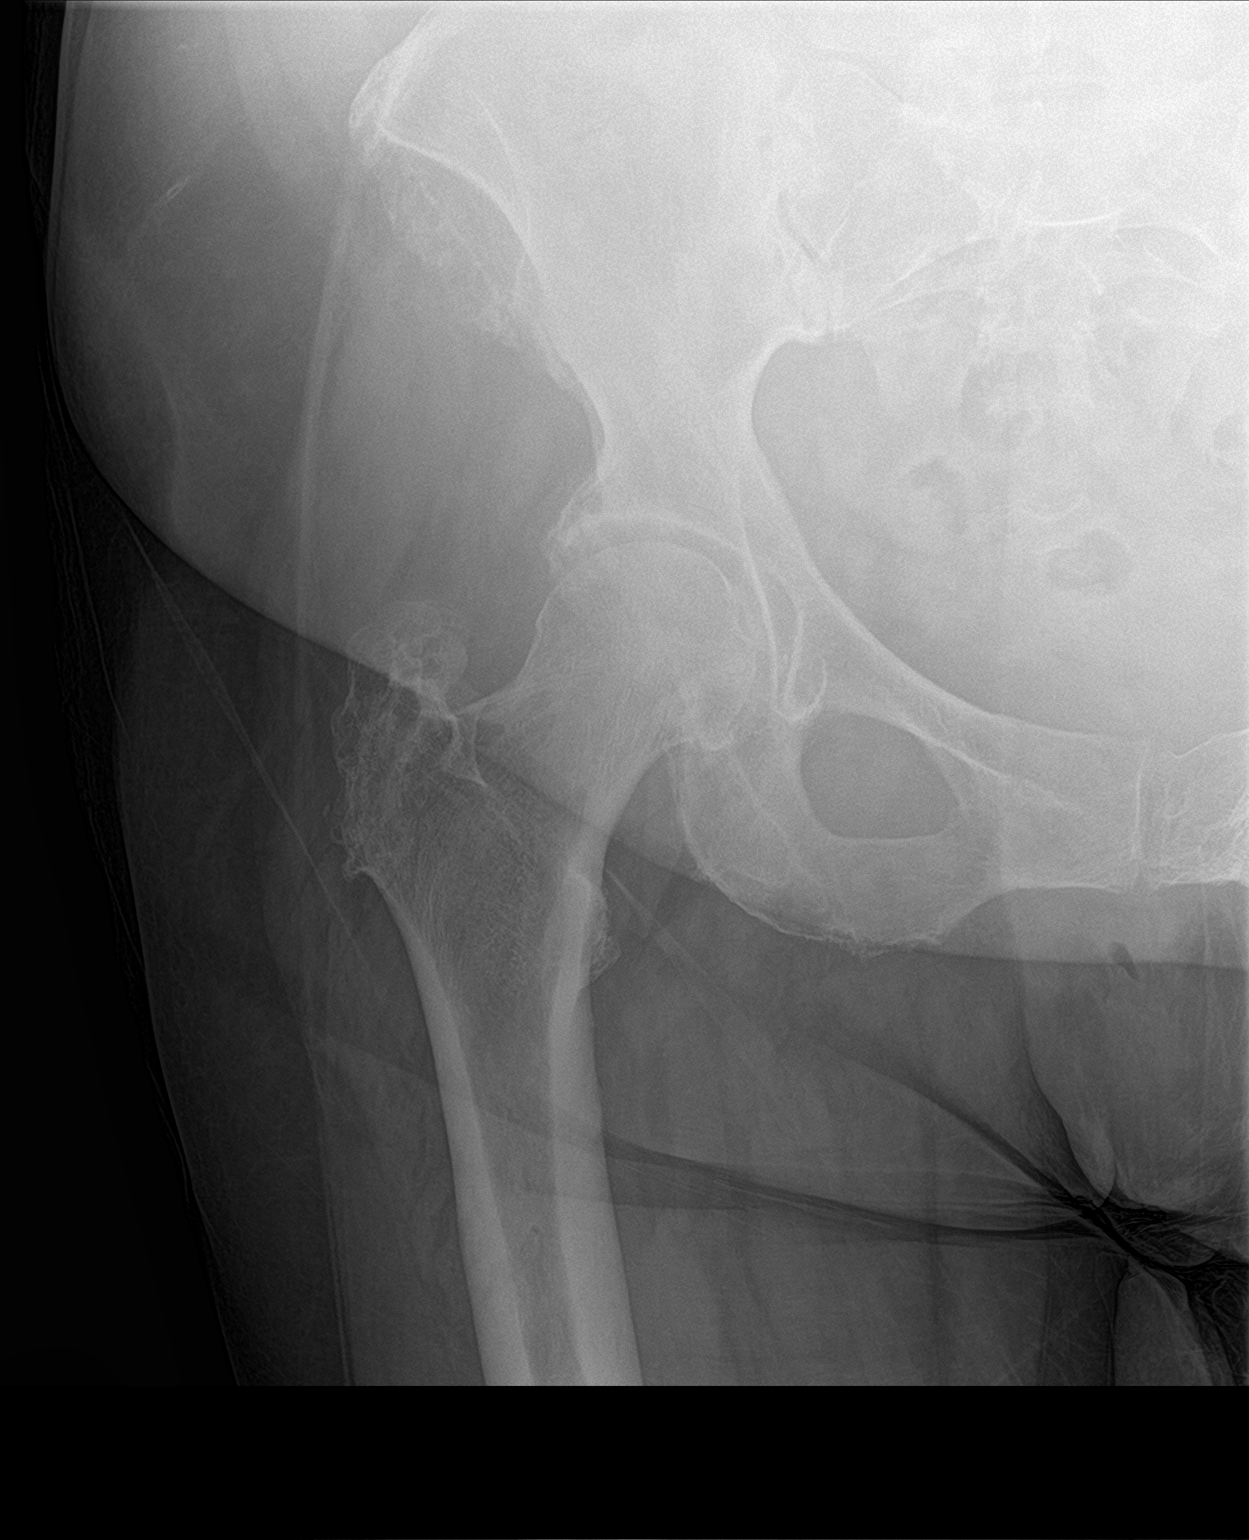
[im 2/3]
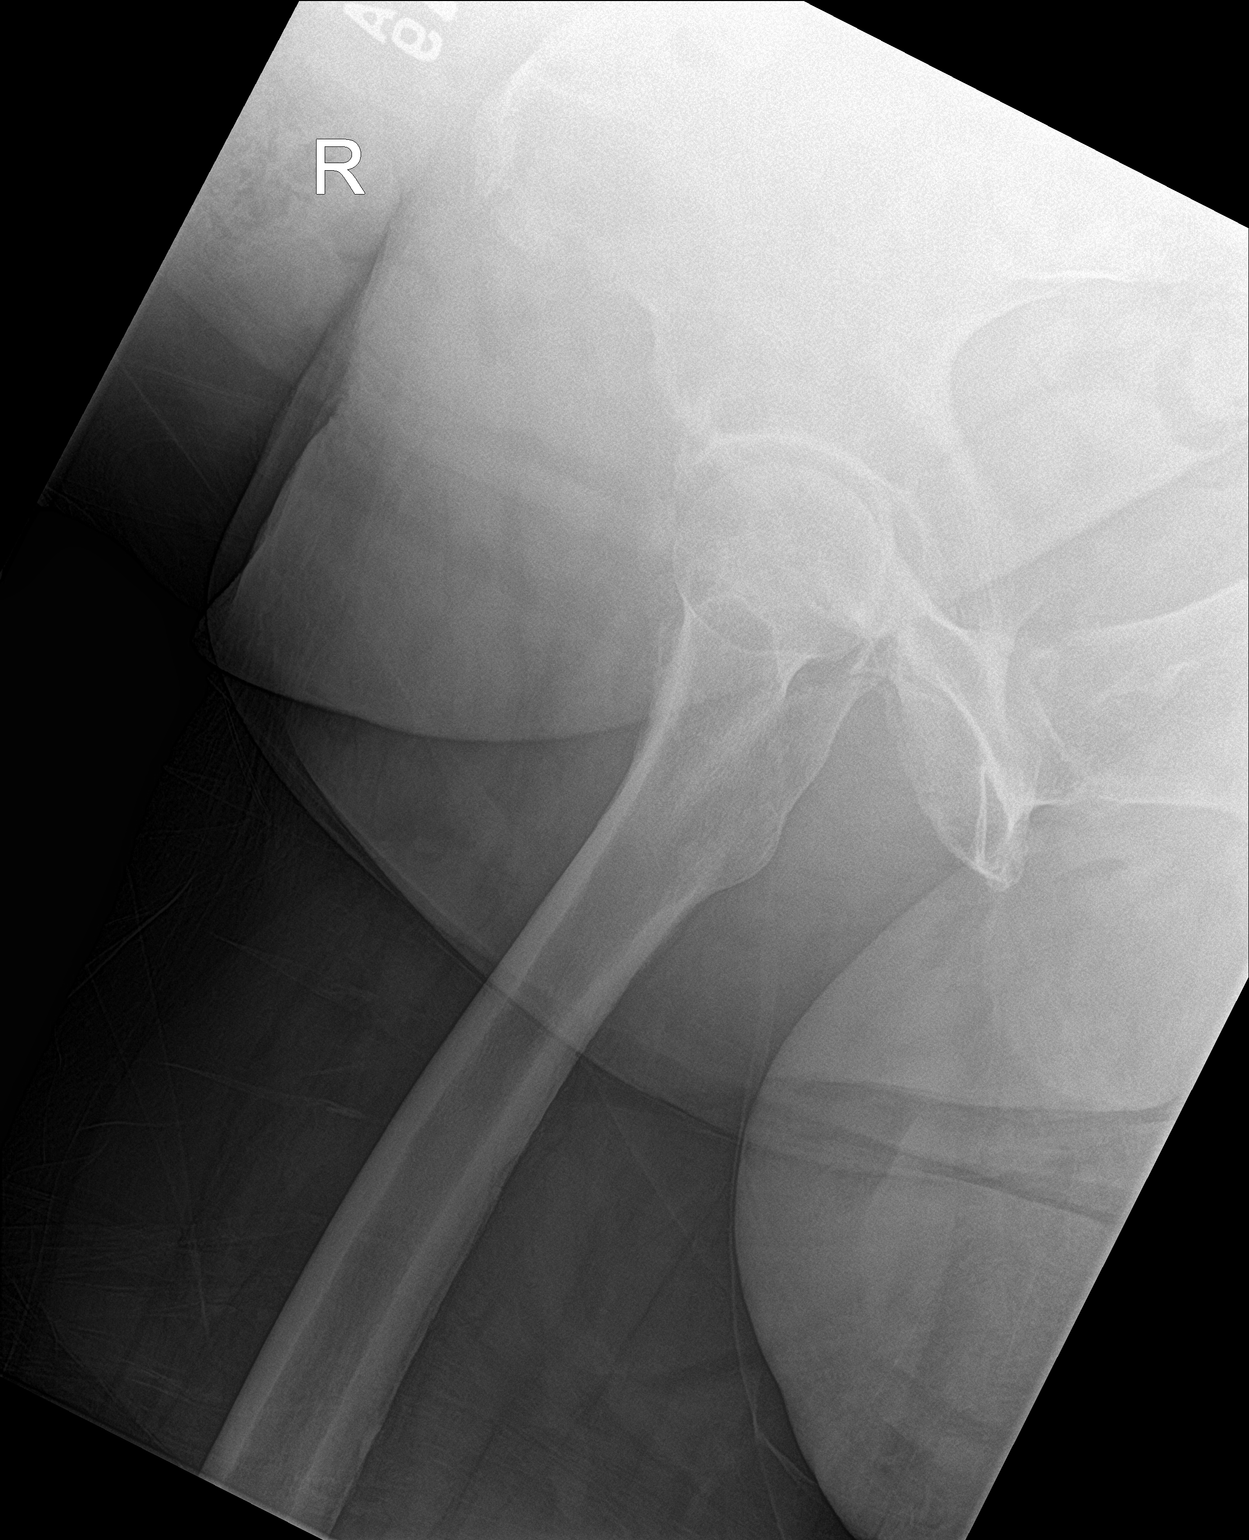
[im 3/3]
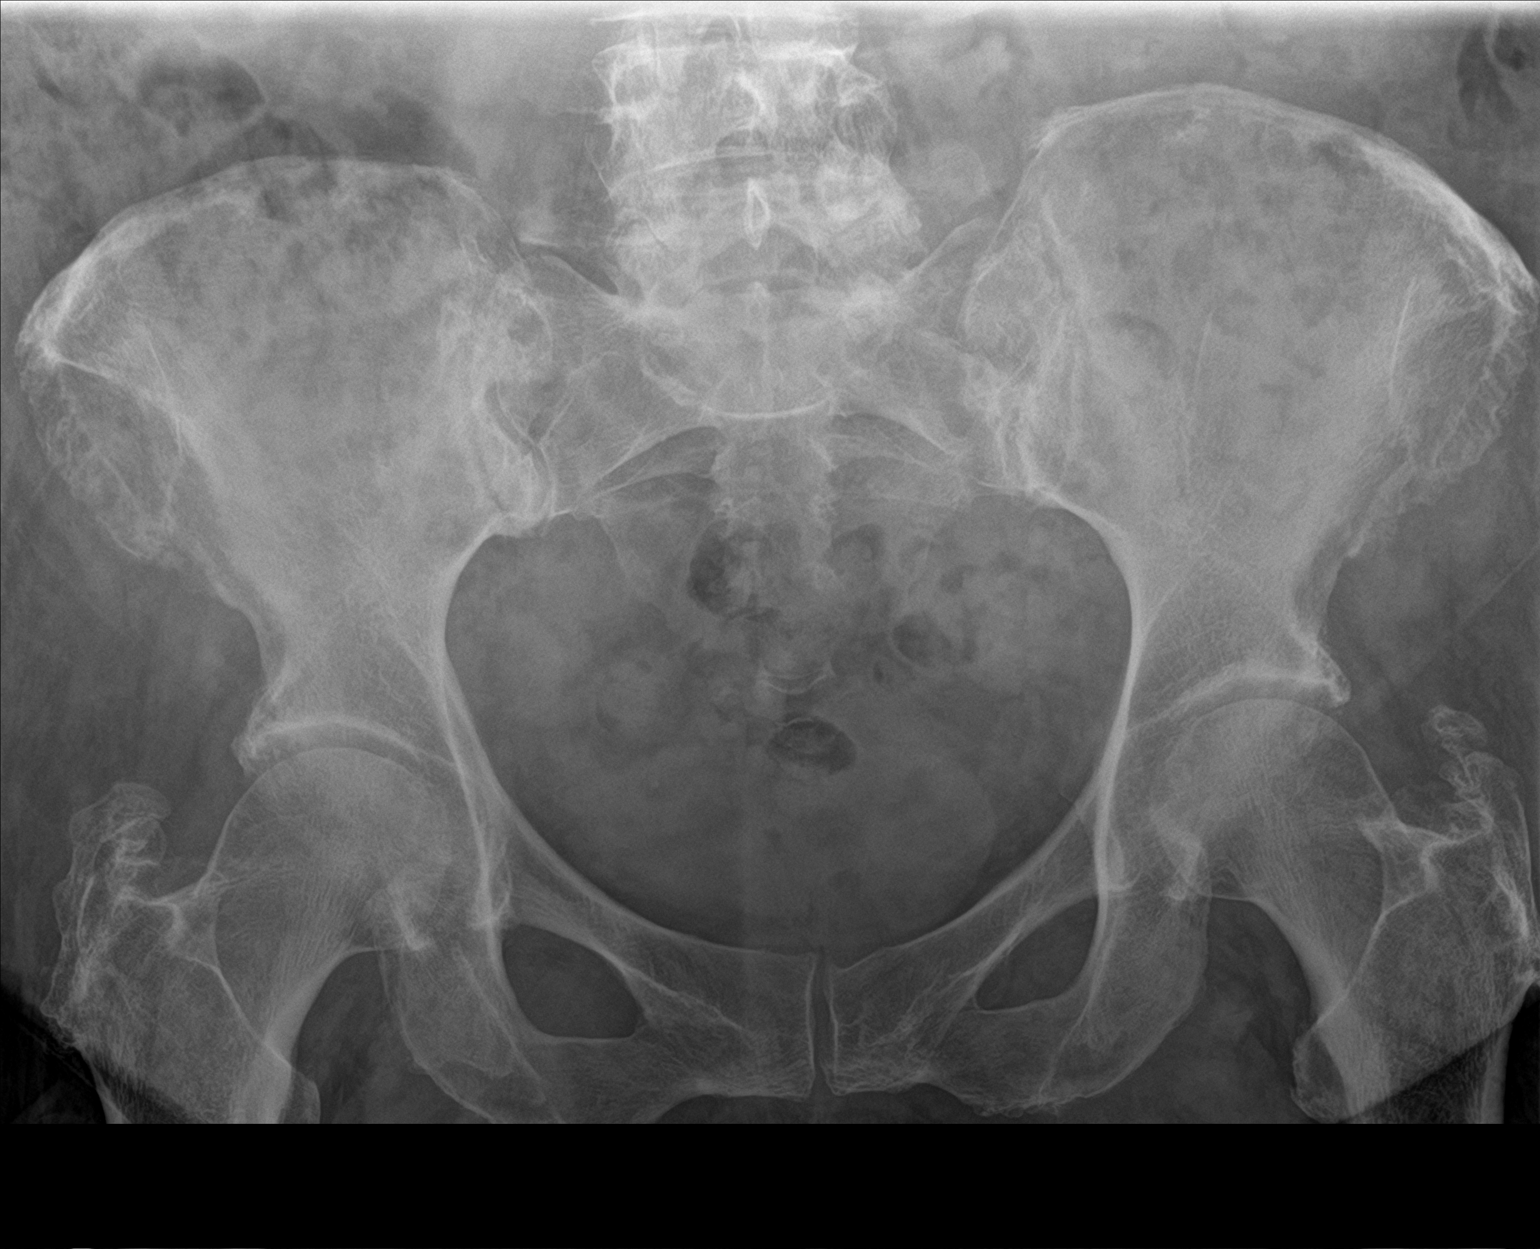

[3 of 3 positions shown; findings below may reference images not displayed]

FINDINGS: SI joints show degenerative changes. Pubic symphysis and rami are
intact. Mild arthritis of the bilateral hips.
IMPRESSION: No acute osseous abnormality.  Mild arthritis of the hips.

## 2019-03-23 IMAGING — CR DG KNEE COMPLETE 4+V*R*
1 series · 4 of 4 positions shown · non-contrast
Comparison: Report 02/24/2000

CLINICAL DATA: History of knee replacement with knee pain

EXAM:
RIGHT KNEE - COMPLETE 4+ VIEW

[Series 1: dg knee complete 4 views right · 0.14mm/px · 4 of 4 slices shown]
[im 1/4]
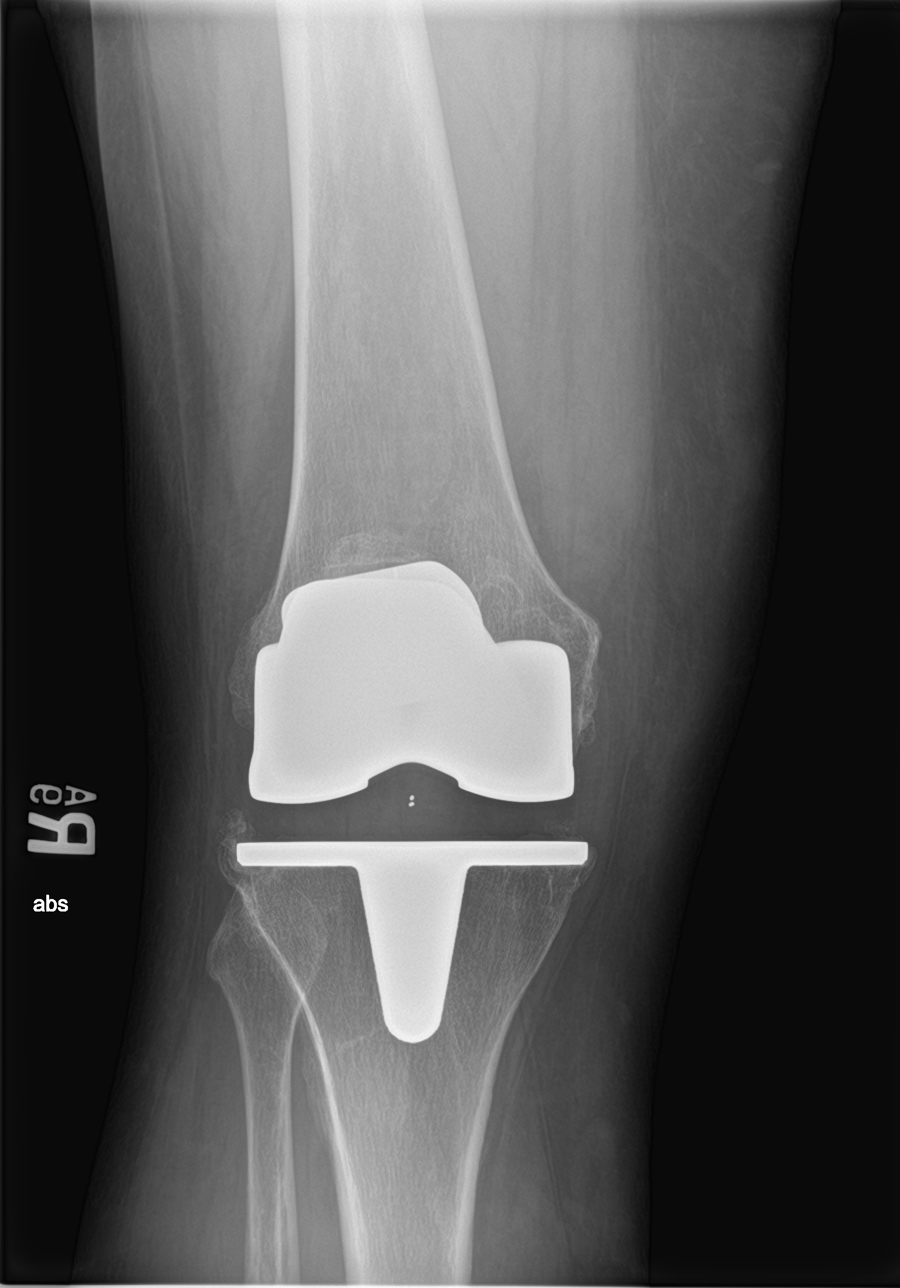
[im 2/4]
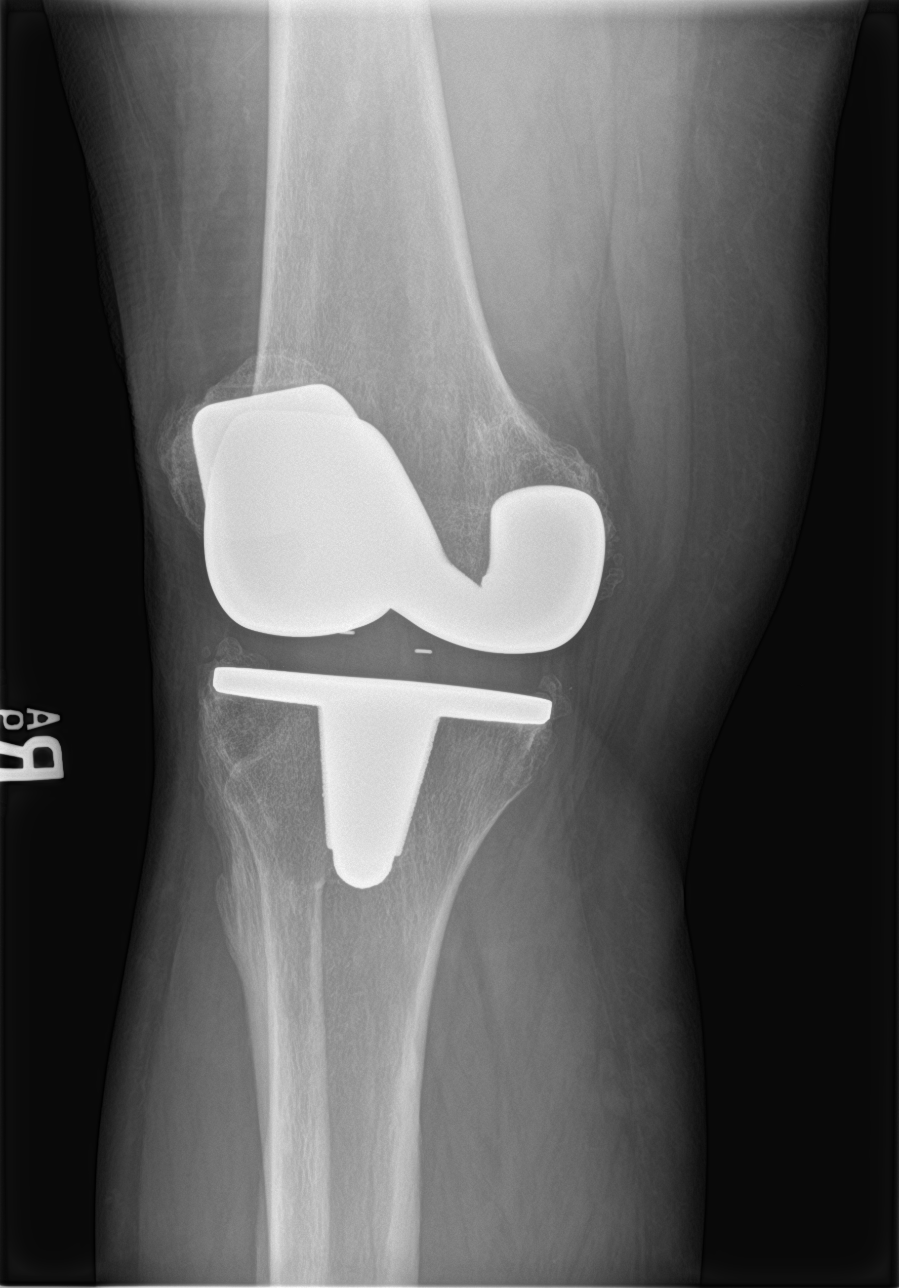
[im 3/4]
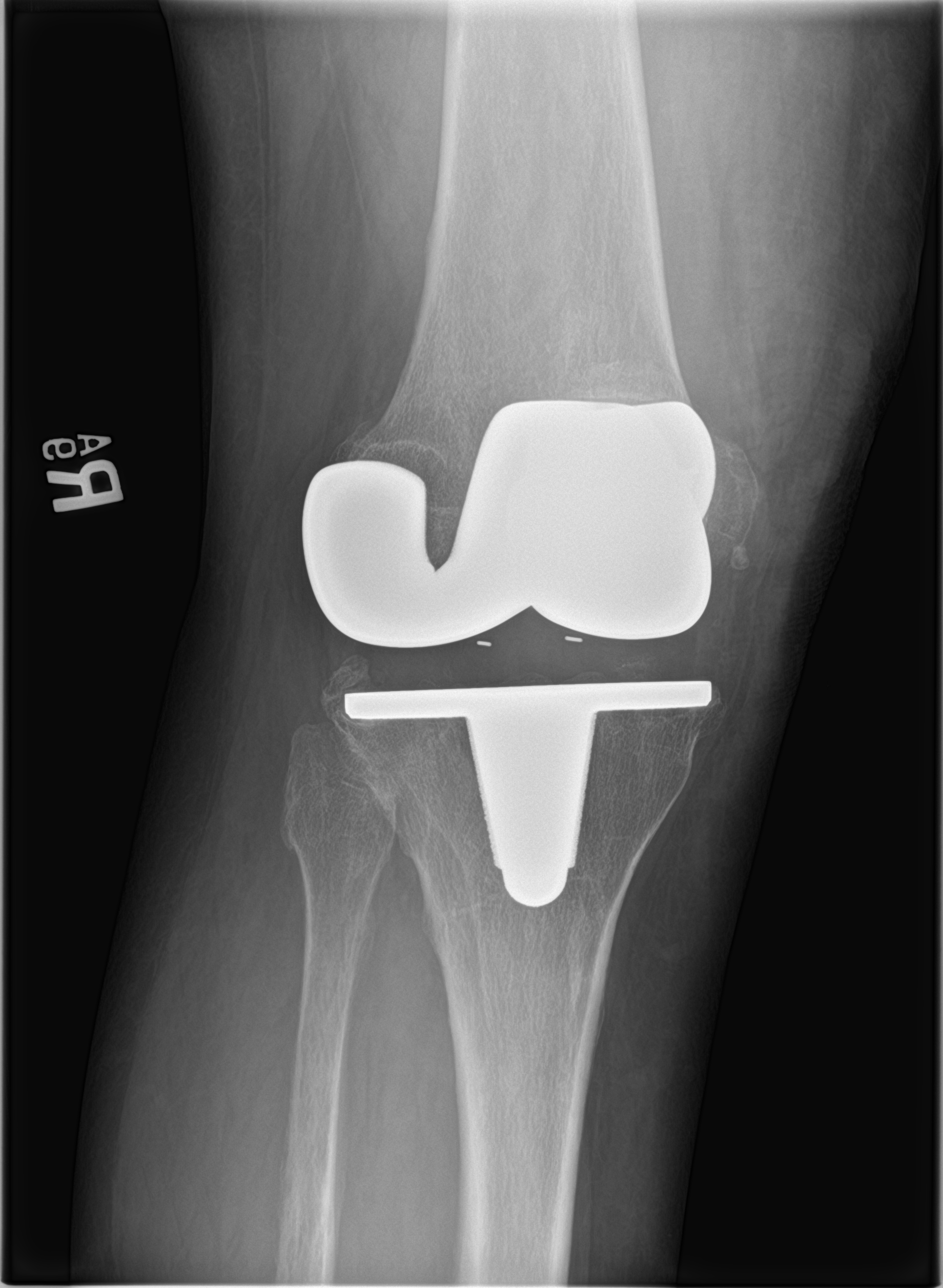
[im 4/4]
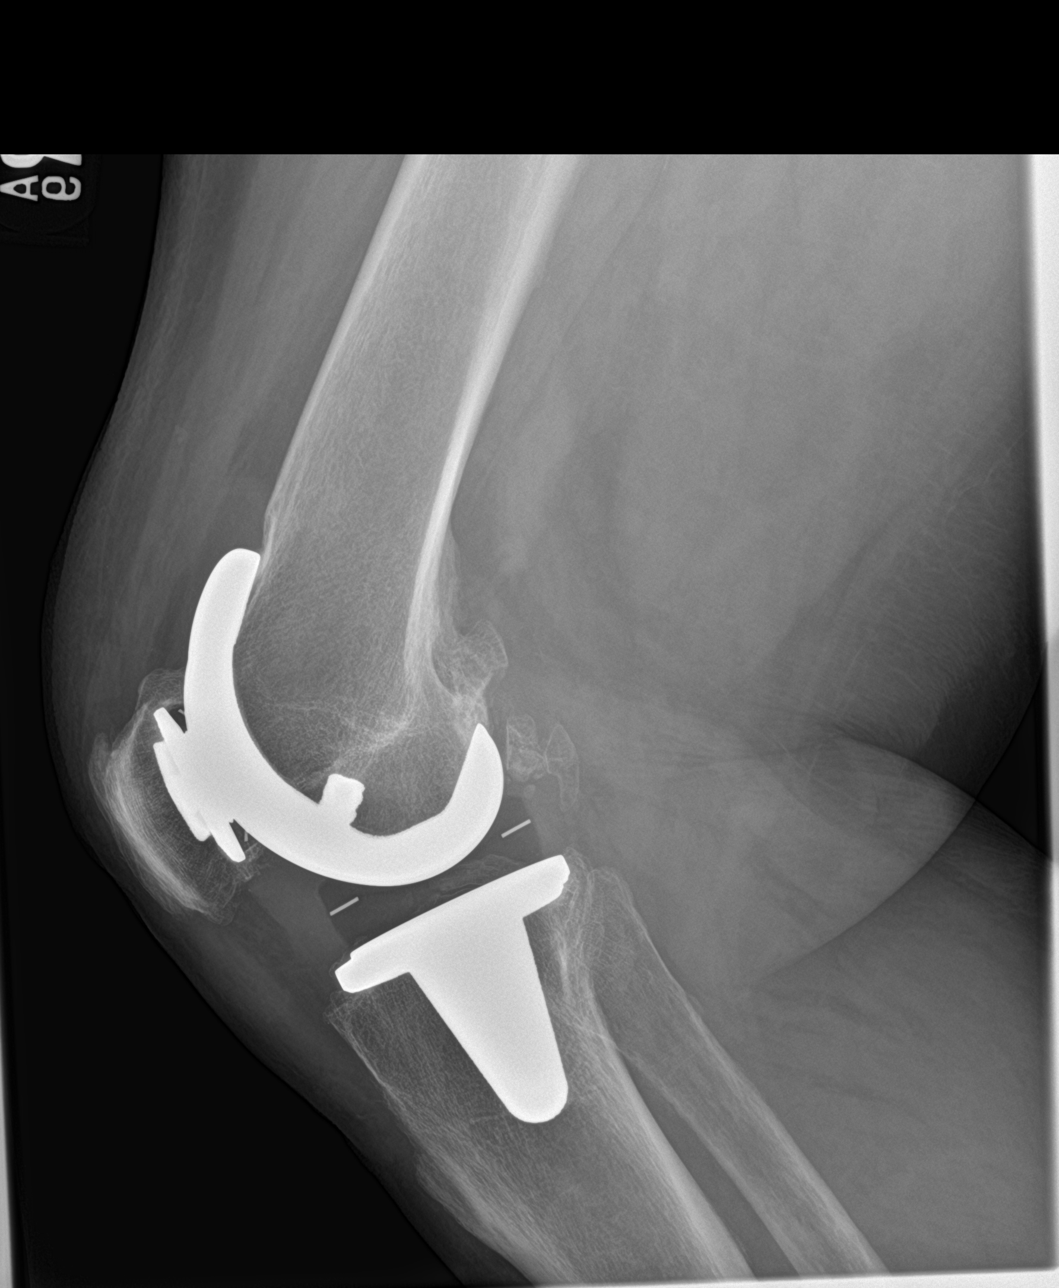

[4 of 4 positions shown; findings below may reference images not displayed]

FINDINGS: Status post right knee replacement with intact hardware and normal
alignment. No fracture. No large knee effusion.
IMPRESSION: Status post right knee replacement without acute osseous abnormality

## 2019-03-23 IMAGING — CR DG HIP (WITH OR WITHOUT PELVIS) 2-3V*L*
1 series · 3 of 3 positions shown · non-contrast
Comparison: None.

CLINICAL DATA: Bilateral hip pain

EXAM:
DG HIP (WITH OR WITHOUT PELVIS) 2-3V LEFT

[Series 1: dg hip unilat w or w/o pelvis 2-3 views  · non-contrast · 0.14mm/px · 3 of 3 slices shown]
[im 1/3]
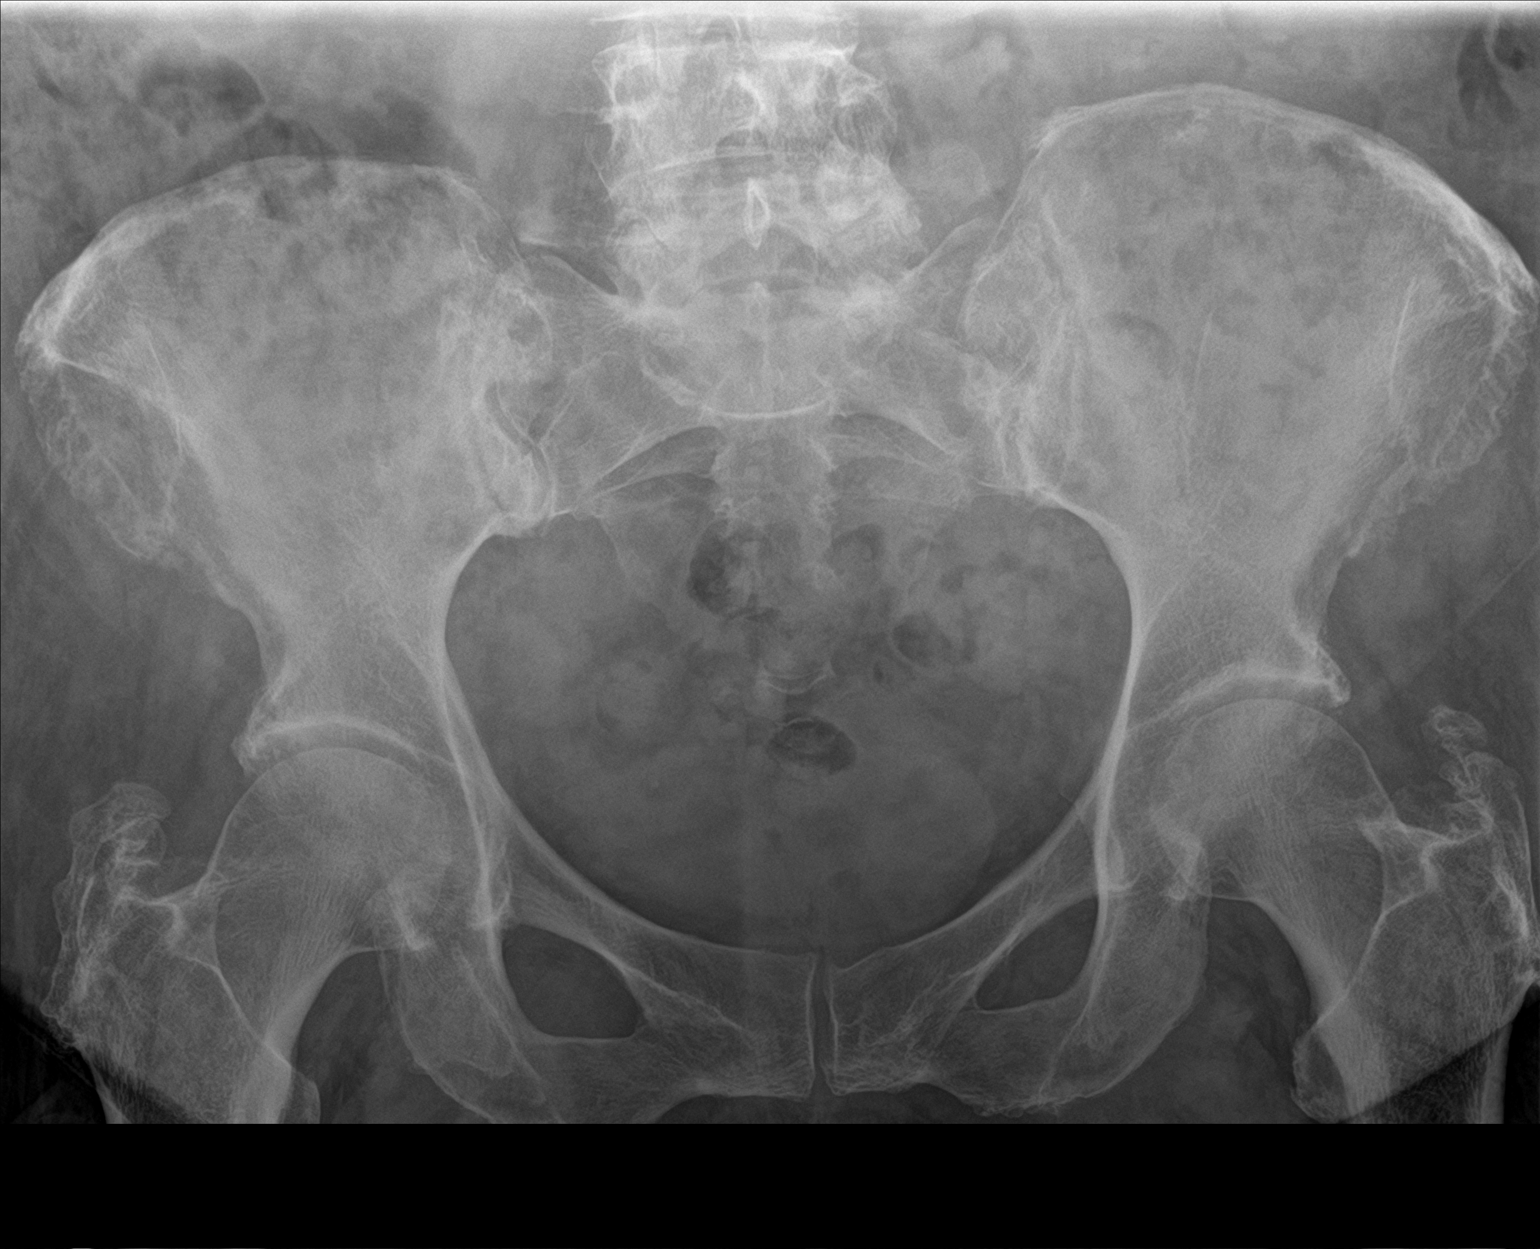
[im 2/3]
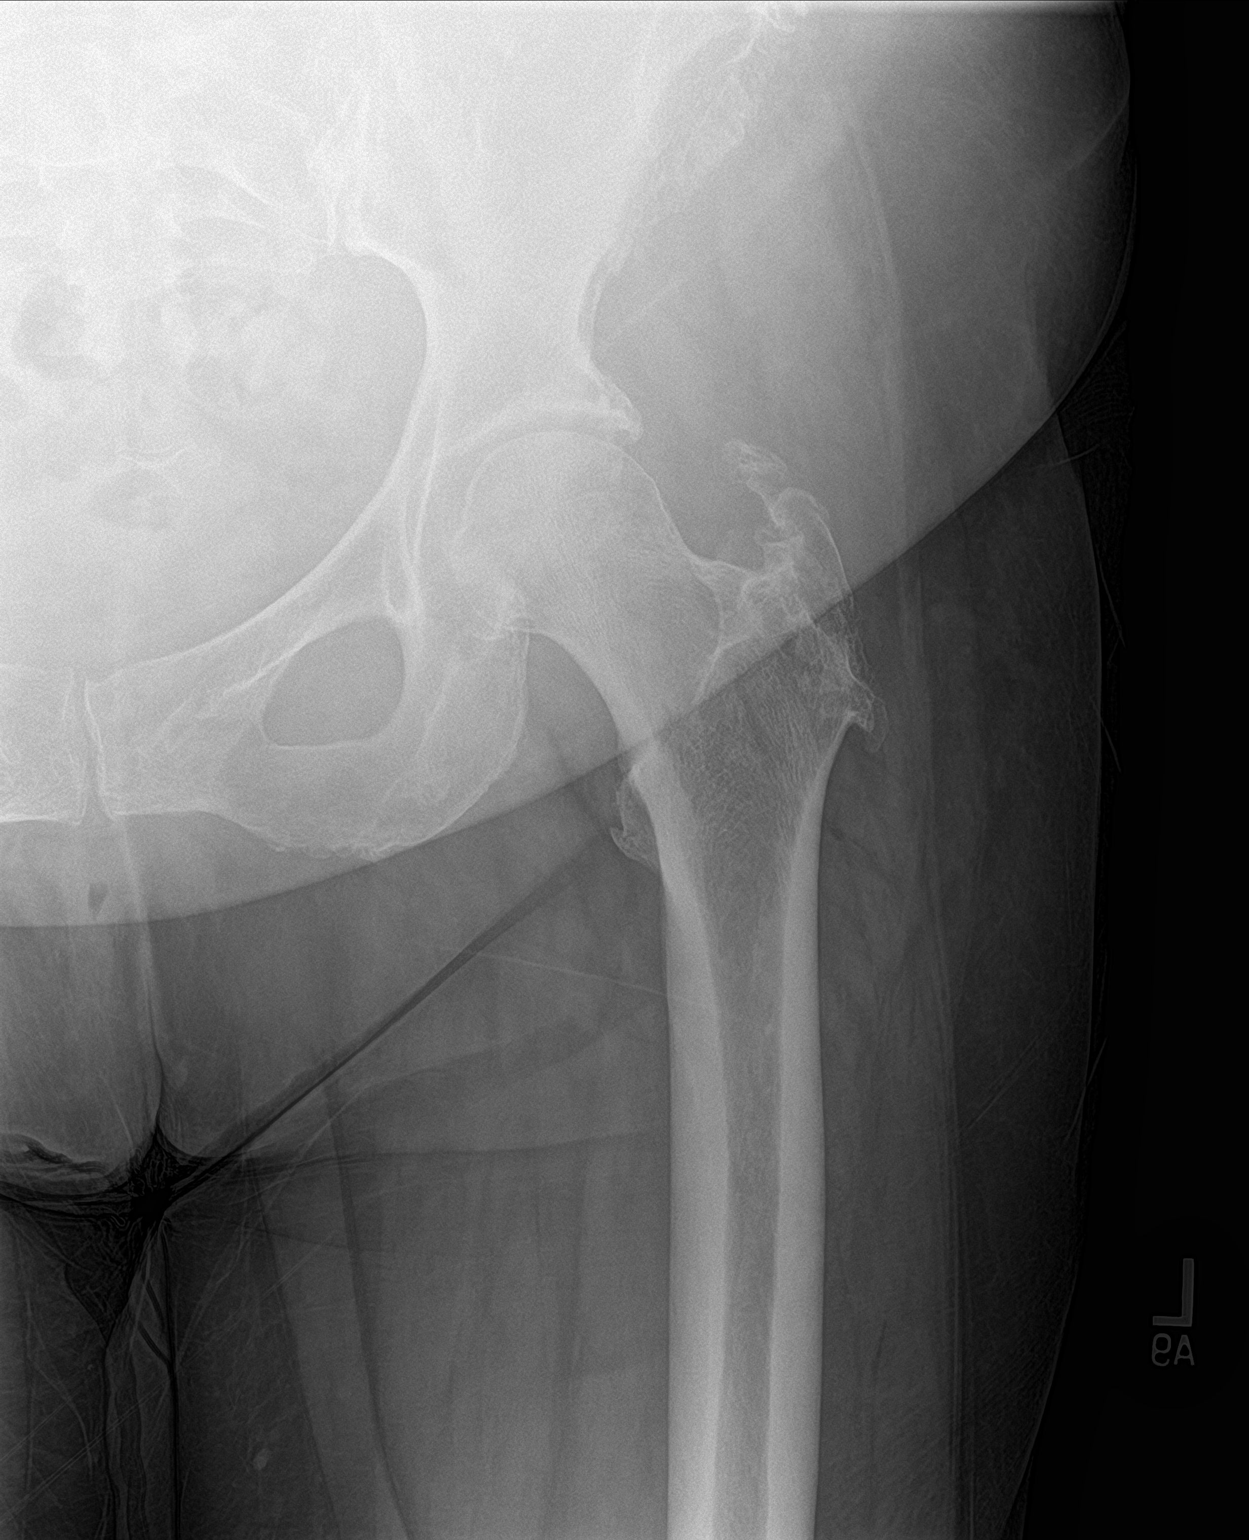
[im 3/3]
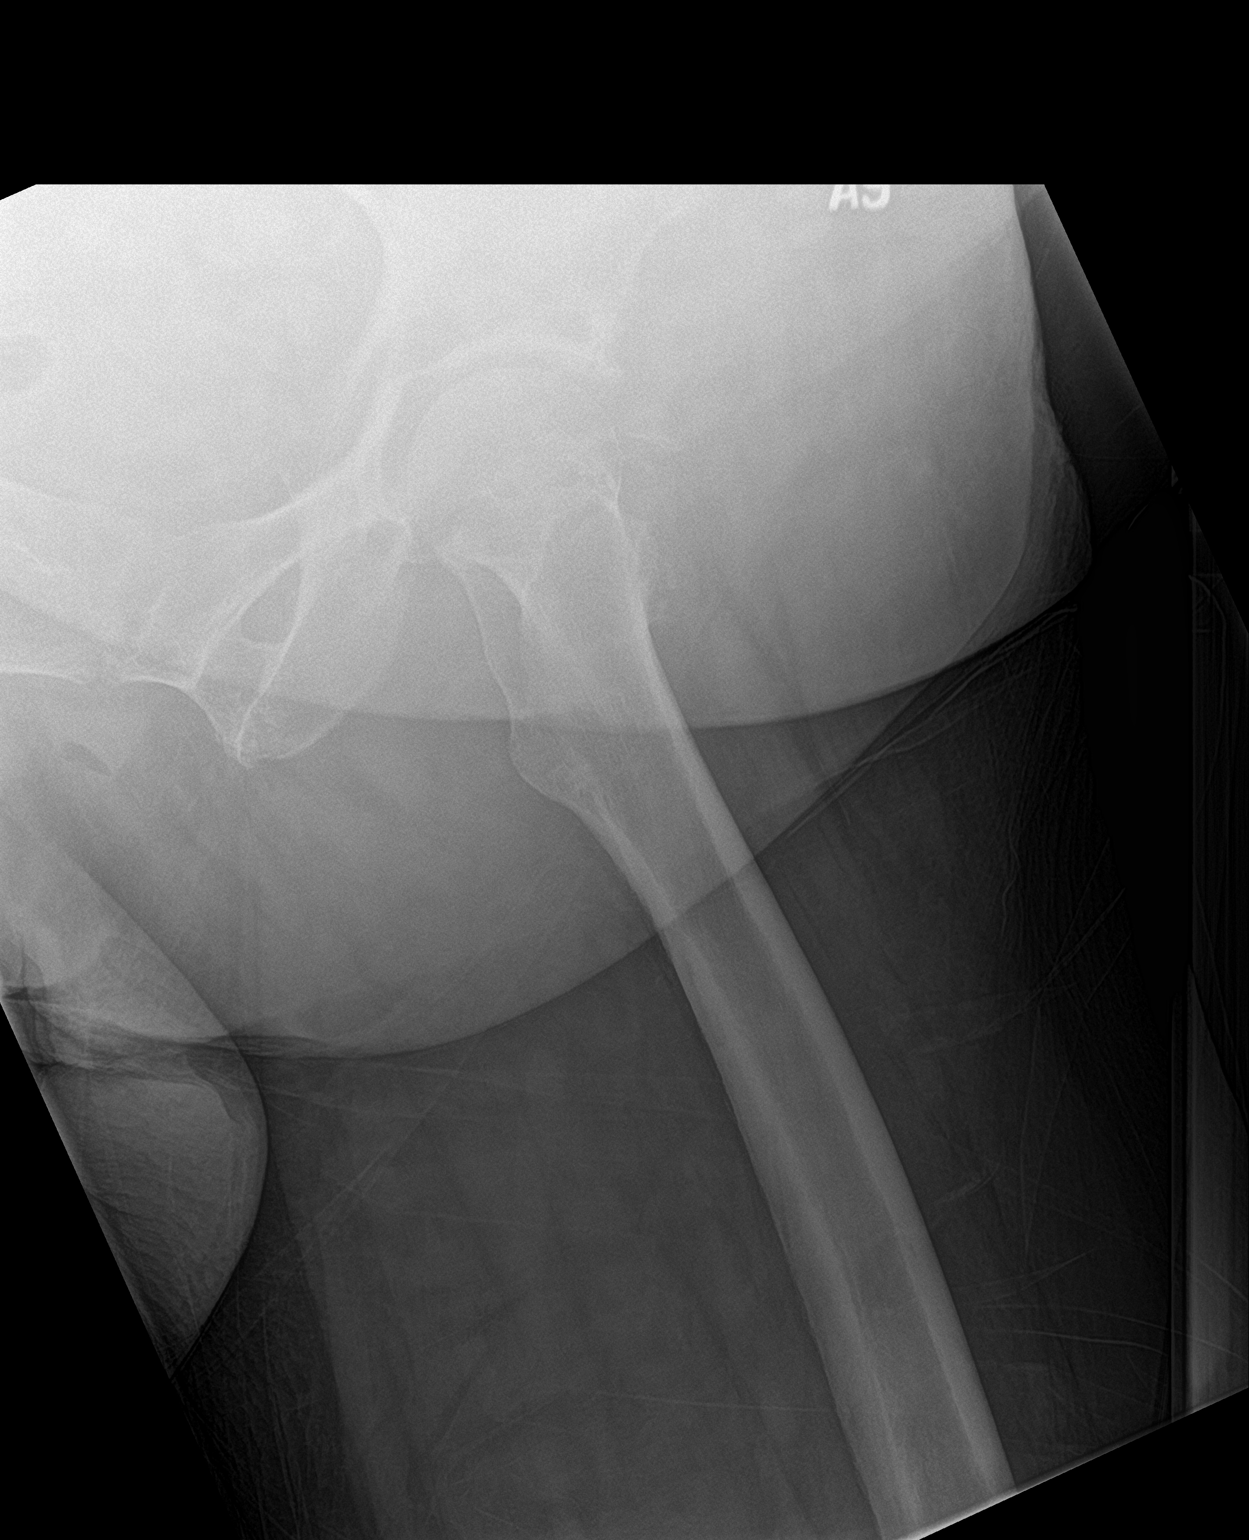

[3 of 3 positions shown; findings below may reference images not displayed]

FINDINGS: Bilateral SI joint degenerative changes. Pubic symphysis and rami
are intact. Mild arthritis of the bilateral hips. No fracture or
malalignment.
IMPRESSION: 1. No acute osseous abnormality
2. Mild arthritis of the hips

## 2021-04-10 NOTE — Progress Notes (Addendum)
COVID swab appointment: N/A  COVID Vaccine Completed:  Yes x2 Date COVID Vaccine completed: Has received booster: Yes x3 COVID vaccine manufacturer:    Moderna     Date of COVID positive in last 90 days: No  PCP - Vidal Schwalbe, MD Cardiologist - N/A  Chest x-ray - N/A EKG - 04-11-21 Epic Stress Test - greater than 10 years ECHO - N/A Cardiac Cath - N/A Pacemaker/ICD device last checked: Spinal Cord Stimulator:  Sleep Study - No CPAP -   Fasting Blood Sugar - 130s Checks Blood Sugar - once a day  Blood Thinner Instructions: N/A Aspirin Instructions: Last Dose:  Activity level:  Can go up a flight of stairs and perform activities of daily living without stopping and without symptoms of chest pain or shortness of breath.    Anesthesia review:  A1c 8.8 03-25-21  Patient denies shortness of breath, fever, cough and chest pain at PAT appointment  Patient verbalized understanding of instructions that were given to them at the PAT appointment. Patient was also instructed that they will need to review over the PAT instructions again at home before surgery.

## 2021-04-10 NOTE — Patient Instructions (Addendum)
DUE TO COVID-19 ONLY ONE VISITOR IS ALLOWED TO COME WITH YOU AND STAY IN THE WAITING ROOM ONLY DURING PRE OP AND PROCEDURE.   **NO VISITORS ARE ALLOWED IN THE SHORT STAY AREA OR RECOVERY ROOM!!**   You are not required to quarantine, however you are required to wear a well-fitted mask when you are out and around people not in your household.  Hand Hygiene often Do NOT share personal items Notify your provider if you are in close contact with someone who has COVID or you develop fever 100.4 or greater, new onset of sneezing, cough, sore throat, shortness of breath or body aches.   Your procedure is scheduled on: Thursday, 04-18-21   Report to [facility] Main  Entrance     Report to admitting at 9:45 AM   Call this number if you have problems the morning of surgery [phone]   Do not eat food :After Midnight.   May have liquids until 9:30 AM  day of surgery  CLEAR LIQUID DIET  Foods Allowed                                                                     Foods Excluded  Water, Black Coffee (no milk/no creamer) and tea, regular and decaf                              liquids that you cannot  Plain Jell-O in any flavor  (No red)                         see through such as: Fruit ices (not with fruit pulp)                                 milk, soups, orange juice  Iced Popsicles (No red)                                    All solid food                             Apple juices Sports drinks like Gatorade (No red) Lightly seasoned clear broth or consume(fat free) Sugar     Complete one G2 drink the morning of surgery at 9:30 AM the day of surgery.      The day of surgery:  Drink ONE (1) Pre-Surgery G2 the morning of surgery. Drink in one sitting. Do not sip.  This drink was given to you during your hospital  pre-op appointment visit. Nothing else to drink after completing the Pre-Surgery G2.          If you have questions, please contact your surgeons office.      Oral Hygiene is also important to reduce your risk of infection.                                    Remember - BRUSH  YOUR TEETH THE MORNING OF SURGERY WITH YOUR REGULAR TOOTHPASTE   Do NOT smoke after Midnight   Take these medicines the morning of surgery with A SIP OF WATER: Amlodipine, Zyrtec  How to Manage Your Diabetes Before and After Surgery  Why is it important to control my blood sugar before and after surgery? Improving blood sugar levels before and after surgery helps healing and can limit problems. A way of improving blood sugar control is eating a healthy diet by:  Eating less sugar and carbohydrates  Increasing activity/exercise  Talking with your doctor about reaching your blood sugar goals High blood sugars (greater than 180 mg/dL) can raise your risk of infections and slow your recovery, so you will need to focus on controlling your diabetes during the weeks before surgery. Make sure that the doctor who takes care of your diabetes knows about your planned surgery including the date and location.  How do I manage my blood sugar before surgery? Check your blood sugar at least 4 times a day, starting 2 days before surgery, to make sure that the level is not too high or low. Check your blood sugar the morning of your surgery when you wake up and every 2 hours until you get to the Short Stay unit. If your blood sugar is less than 70 mg/dL, you will need to treat for low blood sugar: Do not take insulin. Treat a low blood sugar (less than 70 mg/dL) with  cup of clear juice (cranberry or apple), 4 glucose tablets, OR glucose gel. Recheck blood sugar in 15 minutes after treatment (to make sure it is greater than 70 mg/dL). If your blood sugar is not greater than 70 mg/dL on recheck, call [phone] for further instructions. Report your blood sugar to the short stay nurse when you get to Short Stay.  If you are admitted to the hospital after surgery: Your blood sugar will  be checked by the staff and you will probably be given insulin after surgery (instead of oral diabetes medicines) to make sure you have good blood sugar levels. The goal for blood sugar control after surgery is 80-180 mg/dL.   WHAT DO I DO ABOUT MY DIABETES MEDICATION?  Do not take oral diabetes medicines (pills) the morning of surgery.  THE NIGHT BEFORE SURGERY:  Take Glipizide morning dose.  Take Metformin as prescribed.  THE MORNING OF SURGERY: Do not take Metformin or Glipizide.  Reviewed and Endorsed by [facility] Patient Education Committee, August 2015    Stop all vitamins and herbal supplements a week before surgery             You may not have any metal on your body including hair pins, jewelry, and body piercing             Do not wear make-up, lotions, powders, perfumes/ or deodorant  Do not wear nail polish including gel and S&S, artificial/acrylic nails, or any other type of covering on natural nails including finger and toenails. If you have artificial nails, gel coating, etc. that needs to be removed by a nail salon please have this removed prior to surgery or surgery may need to be canceled/ delayed if the surgeon/ anesthesia feels like they are unable to be safely monitored.   Do not shave  48 hours prior to surgery.      Do not bring valuables to the hospital. [place].   Contacts, dentures or bridgework may not be worn  into surgery.   Patients discharged the day of surgery will not be allowed to drive home.   A responsible adult must remain with you for 24 hours after surgery.   Please read over the following fact sheets you were given: IF YOU HAVE QUESTIONS ABOUT YOUR PRE OP INSTRUCTIONS PLEASE CALL [facility]- Preparing for Total Shoulder Arthroplasty    Before surgery, you can play an important role. Because skin is not sterile, your skin needs to be as free of germs as possible. You can reduce the  number of germs on your skin by using the following products. Benzoyl Peroxide Gel Reduces the number of germs present on the skin Applied twice a day to shoulder area starting two days before surgery    ==================================================================  Please follow these instructions carefully:  BENZOYL PEROXIDE 5% GEL  Please do not use if you have an allergy to benzoyl peroxide.   If your skin becomes reddened/irritated stop using the benzoyl peroxide.  Starting two days before surgery, apply as follows: Apply benzoyl peroxide in the morning and at night. Apply after taking a shower. If you are not taking a shower clean entire shoulder front, back, and side along with the armpit with a clean wet washcloth.  Place a quarter-sized dollop on your shoulder and rub in thoroughly, making sure to cover the front, back, and side of your shoulder, along with the armpit.   2 days before ____ AM   ____ PM              1 day before ____ AM   ____ PM                         Do this twice a day for two days.  (Last application is the night before surgery, AFTER using the CHG soap as described below).  Do NOT apply benzoyl peroxide gel on the day of surgery.    [facility] - Preparing for Surgery Before surgery, you can play an important role.  Because skin is not sterile, your skin needs to be as free of germs as possible.  You can reduce the number of germs on your skin by washing with CHG (chlorahexidine gluconate) soap before surgery.  CHG is an antiseptic cleaner which kills germs and bonds with the skin to continue killing germs even after washing. Please DO NOT use if you have an allergy to CHG or antibacterial soaps.  If your skin becomes reddened/irritated stop using the CHG and inform your nurse when you arrive at Short Stay. Do not shave (including legs and underarms) for at least 48 hours prior to the first CHG shower.  You may shave your face/neck.  Please follow  these instructions carefully:  1.  Shower with CHG Soap the night before surgery and the  morning of surgery.  2.  If you choose to wash your hair, wash your hair first as usual with your normal  shampoo.  3.  After you shampoo, rinse your hair and body thoroughly to remove the shampoo.                             4.  Use CHG as you would any other liquid soap.  You can apply chg directly to the skin and wash.  Gently with a scrungie or clean washcloth.  5.  Apply the CHG Soap to your  body ONLY FROM THE NECK DOWN.   Do   not use on face/ open                           Wound or open sores. Avoid contact with eyes, ears mouth and   genitals (private parts).                       Wash face,  Genitals (private parts) with your normal soap.             6.  Wash thoroughly, paying special attention to the area where your    surgery  will be performed.  7.  Thoroughly rinse your body with warm water from the neck down.  8.  DO NOT shower/wash with your normal soap after using and rinsing off the CHG Soap.                9.  Pat yourself dry with a clean towel.            10.  Wear clean pajamas.            11.  Place clean sheets on your bed the night of your first shower and do not  sleep with pets. Day of Surgery : Do not apply any lotions/deodorants the morning of surgery.  Please wear clean clothes to the hospital/surgery center.  FAILURE TO FOLLOW THESE INSTRUCTIONS MAY RESULT IN THE CANCELLATION OF YOUR SURGERY  PATIENT SIGNATURE_________________________________  NURSE SIGNATURE__________________________________  ________________________________________________________________________     Adam Phenix  An incentive spirometer is a tool that can help keep your lungs clear and active. This tool measures how well you are filling your lungs with each breath. Taking long deep breaths may help reverse or decrease the chance of developing breathing (pulmonary) problems (especially  infection) following: A long period of time when you are unable to move or be active. BEFORE THE PROCEDURE  If the spirometer includes an indicator to show your best effort, your nurse or respiratory therapist will set it to a desired goal. If possible, sit up straight or lean slightly forward. Try not to slouch. Hold the incentive spirometer in an upright position. INSTRUCTIONS FOR USE  Sit on the edge of your bed if possible, or sit up as far as you can in bed or on a chair. Hold the incentive spirometer in an upright position. Breathe out normally. Place the mouthpiece in your mouth and seal your lips tightly around it. Breathe in slowly and as deeply as possible, raising the piston or the ball toward the top of the column. Hold your breath for 3-5 seconds or for as long as possible. Allow the piston or ball to fall to the bottom of the column. Remove the mouthpiece from your mouth and breathe out normally. Rest for a few seconds and repeat Steps 1 through 7 at least 10 times every 1-2 hours when you are awake. Take your time and take a few normal breaths between deep breaths. The spirometer may include an indicator to show your best effort. Use the indicator as a goal to work toward during each repetition. After each set of 10 deep breaths, practice coughing to be sure your lungs are clear. If you have an incision (the cut made at the time of surgery), support your incision when coughing by placing a pillow or rolled up towels firmly against it. Once you are able to  get out of bed, walk around indoors and cough well. You may stop using the incentive spirometer when instructed by your caregiver.  RISKS AND COMPLICATIONS Take your time so you do not get dizzy or light-headed. If you are in pain, you may need to take or ask for pain medication before doing incentive spirometry. It is harder to take a deep breath if you are having pain. AFTER USE Rest and breathe slowly and easily. It can be  helpful to keep track of a log of your progress. Your caregiver can provide you with a simple table to help with this. If you are using the spirometer at home, follow these instructions: [place] IF:  You are having difficultly using the spirometer. You have trouble using the spirometer as often as instructed. Your pain medication is not giving enough relief while using the spirometer. You develop fever of 100.5 F (38.1 C) or higher. SEEK IMMEDIATE MEDICAL CARE IF:  You cough up bloody sputum that had not been present before. You develop fever of 102 F (38.9 C) or greater. You develop worsening pain at or near the incision site. MAKE SURE YOU:  Understand these instructions. Will watch your condition. Will get help right away if you are not doing well or get worse. Document Released: 08/04/2006 Document Revised: 06/16/2011 Document Reviewed: 10/05/2006 [facility] Patient Information 2014 [place], [place].   ________________________________________________________________________

## 2021-04-11 ENCOUNTER — Encounter (HOSPITAL_COMMUNITY): Payer: Self-pay

## 2021-04-11 ENCOUNTER — Encounter (HOSPITAL_COMMUNITY)
Admission: RE | Admit: 2021-04-11 | Discharge: 2021-04-11 | Disposition: A | Discharge status: Home / Self Care | Payer: Medicare PPO | Source: Ambulatory Visit | Attending: Orthopedic Surgery | Admitting: Orthopedic Surgery

## 2021-04-11 ENCOUNTER — Other Ambulatory Visit: Payer: Self-pay

## 2021-04-11 ENCOUNTER — Encounter (INDEPENDENT_AMBULATORY_CARE_PROVIDER_SITE_OTHER): Payer: Self-pay

## 2021-04-11 VITALS — BP 142/82 | HR 72 | Temp 98.4°F | Resp 20 | Ht 68.0 in | Wt 280.0 lb

## 2021-04-11 DIAGNOSIS — Z86718 Personal history of other venous thrombosis and embolism: Secondary | ICD-10-CM | POA: Insufficient documentation

## 2021-04-11 DIAGNOSIS — Z01818 Encounter for other preprocedural examination: Secondary | ICD-10-CM | POA: Diagnosis not present

## 2021-04-11 DIAGNOSIS — I10 Essential (primary) hypertension: Secondary | ICD-10-CM | POA: Diagnosis not present

## 2021-04-11 DIAGNOSIS — M75101 Unspecified rotator cuff tear or rupture of right shoulder, not specified as traumatic: Secondary | ICD-10-CM | POA: Diagnosis not present

## 2021-04-11 DIAGNOSIS — E119 Type 2 diabetes mellitus without complications: Secondary | ICD-10-CM | POA: Insufficient documentation

## 2021-04-11 DIAGNOSIS — Z87891 Personal history of nicotine dependence: Secondary | ICD-10-CM | POA: Insufficient documentation

## 2021-04-11 HISTORY — DX: Pneumonia, unspecified organism: J18.9

## 2021-04-11 HISTORY — DX: Other complications of anesthesia, initial encounter: T88.59XA

## 2021-04-11 HISTORY — DX: Headache, unspecified: R51.9

## 2021-04-11 LAB — CBC
HCT: 46.9 % — ABNORMAL HIGH (ref 36.0–46.0)
Hemoglobin: 15.6 g/dL — ABNORMAL HIGH (ref 12.0–15.0)
MCH: 30.6 pg (ref 26.0–34.0)
MCHC: 33.3 g/dL (ref 30.0–36.0)
MCV: 92 fL (ref 80.0–100.0)
Platelets: 274 10*3/uL (ref 150–400)
RBC: 5.1 MIL/uL (ref 3.87–5.11)
RDW: 13.4 % (ref 11.5–15.5)
WBC: 9.9 10*3/uL (ref 4.0–10.5)
nRBC: 0 % (ref 0.0–0.2)

## 2021-04-11 LAB — GLUCOSE, CAPILLARY: Glucose-Capillary: 159 mg/dL — ABNORMAL HIGH (ref 70–99)

## 2021-04-11 LAB — SURGICAL PCR SCREEN
MRSA, PCR: POSITIVE — AB
Staphylococcus aureus: POSITIVE — AB

## 2021-04-11 NOTE — Progress Notes (Signed)
PCR results sent to Dr. Supple to review. 

## 2021-04-12 ENCOUNTER — Encounter (HOSPITAL_COMMUNITY): Payer: Self-pay | Admitting: Physician Assistant

## 2021-04-12 NOTE — Progress Notes (Signed)
Anesthesia Chart Review   Case: [id number] Date/Time: 04/18/21 1215   Procedure: REVERSE SHOULDER ARTHROPLASTY (Right: Shoulder) - 135min   Anesthesia type: General   Pre-op diagnosis: Right shoulder rotator cuff tear arthropathy   Location: [facility] ROOM 06 / [facility]   Surgeons: Justice Britain, MD       DISCUSSION:71 y.o. former smoker with h/o DM II, HTN, DVT, right shoulder rotator cuff tear scheduled for above procedure 04/18/2021 with Dr. Justice Britain.   Poorly controlled diabetes. A1C 8.8 03/25/2021, up from 8.2 11/22/20 (labs on chart). Discussed with Dr. Susie Cassette office.   VS: BP (!) 142/82    Pulse 72    Temp 36.9 C (Oral)    Resp 20    Ht 5\' 8"  (1.727 m)    Wt 127 kg    SpO2 96%    BMI 42.57 kg/m   PROVIDERS: Vidal Schwalbe, MD is PCP    LABS: Labs reviewed: Acceptable for surgery. (all labs ordered are listed, but only abnormal results are displayed)  Labs Reviewed  SURGICAL PCR SCREEN - Abnormal; Notable for the following components:      Result Value   MRSA, PCR POSITIVE (*)    Staphylococcus aureus POSITIVE (*)    All other components within normal limits  CBC - Abnormal; Notable for the following components:   Hemoglobin 15.6 (*)    HCT 46.9 (*)    All other components within normal limits  GLUCOSE, CAPILLARY - Abnormal; Notable for the following components:   Glucose-Capillary 159 (*)    All other components within normal limits     IMAGES:   EKG: 04/11/2021 Rate 71 bpm  Sinus rhythm with Premature atrial complexes Left ventricular hypertrophy with repolarization abnormality ( R in aVL , Cornell product ) Abnormal ECG When compared with ECG of 02-Nov-2018 10:02, No significant change since  CV:  Past Medical History:  Diagnosis Date   Arthritis 12/26/2011   arthritis all joints-bursitis in hip   Bronchitis 12/26/2011   '07- severe case-went home with oxygen for awhile, none now. Occ. bronchitis now-last spring    Cancer [facility])    kidney cancer    Complication of anesthesia    Difficulty waking up   Diabetes mellitus 12/26/2011   oral meds . Diabetic x15 yrs   DVT (deep venous thrombosis) ([place]) 12/26/2011   '74- Rt. lower leg   Headache    History of kidney stones    History of nuclear stress test 12/26/2011   '90-[facility]- normal test   Hypercholesterolemia 12/26/2011   tx. med   Hypertension    Pneumonia    Urinary frequency 12/26/2011   hx.    Past Surgical History:  Procedure Laterality Date   APPENDECTOMY     CHOLECYSTECTOMY  12-26-11   open   ECTOPIC PREGNANCY SURGERY  12-26-11    '81- with tube removed   FOOT SURGERY Left 12/26/2011   '86- bone spurs   HERNIA REPAIR  2015   right lower   JOINT REPLACEMENT  12-26-11   '01/rTKA/'04 LTKA   KIDNEY SURGERY Right    partial kidney removed   KNEE ARTHROSCOPY  12-26-11   bilateral x2 each side, then replacements done   SHOULDER ARTHROSCOPY WITH OPEN ROTATOR CUFF REPAIR Left 09/22/2017   Procedure: SHOULDER ARTHROSCOPY WITH LYSIS AND RESECTION OF ADHESIONS, AND DEBRIDEMENT;  Surgeon: Thornton Park, MD;  Location: [facility] ORS;  Service: Orthopedics;  Laterality: Left;   SHOULDER ARTHROSCOPY WITH ROTATOR CUFF REPAIR Left 11/02/2018  Procedure: SHOULDER ARTHROSCOPY debridement subacromial decompression distal clavicle resection, removal of loose bodies;  Surgeon: Sydnee Cabal, MD;  Location: [facility];  Service: Orthopedics;  Laterality: Left;  with interscalene block   stomach ulcer  12-26-11   stress ulcer '70 related to gallbladder surgery   TONSILLECTOMY  12-26-11   '72   TUBAL LIGATION      MEDICATIONS:  glipiZIDE (GLUCOTROL) 10 MG tablet   rosuvastatin (CRESTOR) 20 MG tablet   amLODipine (NORVASC) 5 MG tablet   cetirizine (ZYRTEC) 10 MG tablet   fluticasone (FLONASE) 50 MCG/ACT nasal spray   furosemide (LASIX) 40 MG tablet   glipiZIDE (GLUCOTROL XL) 5 MG 24 hr tablet   ibuprofen (ADVIL,MOTRIN) 200 MG tablet   lisinopril (PRINIVIL,ZESTRIL) 20 MG  tablet   meloxicam (MOBIC) 15 MG tablet   metFORMIN (GLUCOPHAGE) 1000 MG tablet   Polyethyl Glycol-Propyl Glycol (SYSTANE OP)   potassium chloride SA (K-DUR,KLOR-CON) 20 MEQ tablet   pravastatin (PRAVACHOL) 20 MG tablet   No current facility-administered medications for this encounter.   Konrad Felix Ward, PA-C [facility] Pre-Surgical Testing [phone]

## 2021-04-18 ENCOUNTER — Encounter (HOSPITAL_COMMUNITY): Admission: RE | Payer: Self-pay | Source: Ambulatory Visit

## 2021-04-18 ENCOUNTER — Ambulatory Visit (HOSPITAL_COMMUNITY): Admission: RE | Admit: 2021-04-18 | Payer: Medicare PPO | Source: Ambulatory Visit | Admitting: Orthopedic Surgery

## 2021-04-18 SURGERY — ARTHROPLASTY, SHOULDER, TOTAL, REVERSE
Anesthesia: General | Site: Shoulder | Laterality: Right

## 2022-02-03 ENCOUNTER — Other Ambulatory Visit (HOSPITAL_COMMUNITY): Payer: Self-pay | Admitting: Nephrology

## 2022-02-03 DIAGNOSIS — I129 Hypertensive chronic kidney disease with stage 1 through stage 4 chronic kidney disease, or unspecified chronic kidney disease: Secondary | ICD-10-CM

## 2022-02-03 DIAGNOSIS — E1122 Type 2 diabetes mellitus with diabetic chronic kidney disease: Secondary | ICD-10-CM

## 2022-02-03 DIAGNOSIS — Z905 Acquired absence of kidney: Secondary | ICD-10-CM

## 2022-02-10 ENCOUNTER — Ambulatory Visit
Admission: RE | Admit: 2022-02-10 | Discharge: 2022-02-10 | Disposition: A | Discharge status: Home / Self Care | Payer: Medicare PPO | Source: Ambulatory Visit | Attending: Nephrology | Admitting: Nephrology

## 2022-02-10 DIAGNOSIS — I129 Hypertensive chronic kidney disease with stage 1 through stage 4 chronic kidney disease, or unspecified chronic kidney disease: Secondary | ICD-10-CM | POA: Diagnosis present

## 2022-02-10 DIAGNOSIS — E1122 Type 2 diabetes mellitus with diabetic chronic kidney disease: Secondary | ICD-10-CM | POA: Diagnosis present

## 2022-02-10 DIAGNOSIS — Z905 Acquired absence of kidney: Secondary | ICD-10-CM | POA: Insufficient documentation

## 2022-08-18 ENCOUNTER — Other Ambulatory Visit (HOSPITAL_COMMUNITY): Payer: Self-pay | Admitting: Nephrology

## 2022-08-18 DIAGNOSIS — E1122 Type 2 diabetes mellitus with diabetic chronic kidney disease: Secondary | ICD-10-CM

## 2022-08-18 DIAGNOSIS — R808 Other proteinuria: Secondary | ICD-10-CM

## 2022-08-18 DIAGNOSIS — N281 Cyst of kidney, acquired: Secondary | ICD-10-CM

## 2022-08-18 DIAGNOSIS — N269 Renal sclerosis, unspecified: Secondary | ICD-10-CM

## 2022-08-18 NOTE — Progress Notes (Signed)
Gilmer Mor, DO  Dorise Hiss; P Ir Procedure Requests OK for US guided medical renal biopsy.  It is my understanding that we do not need to send across medical renal biopsies for approval.    [address]

## 2022-08-27 NOTE — H&P (Signed)
Chief Complaint: Patient was seen in consultation today for chronic kidney disease; proteinuria.   Referring Physician(s): Bhutani,Manpreet S  Supervising Physician: Irish Lack  Patient Status: [facility] - Out-pt  History of Present Illness: Grace [name] is a 72 y.o. female with a medical history significant for right renal cell carcinoma s/p partial nephrectomy, DM, HTN and chronic kidney disease. She has had progression of kidney disease since she was first diagnosed in 2013. Recent labs showed nephrotic range proteinuria.   Interventional Radiology has been asked to evaluate this patient for an image-guided non-focal renal biopsy for further work up.   Past Medical History:  Diagnosis Date   Arthritis 12/26/2011   arthritis all joints-bursitis in hip   Bronchitis 12/26/2011   '07- severe case-went home with oxygen for awhile, none now. Occ. bronchitis now-last spring    Cancer [facility])    kidney cancer   Complication of anesthesia    Difficulty waking up   Diabetes mellitus 12/26/2011   oral meds . Diabetic x15 yrs   DVT (deep venous thrombosis) (HCC) 12/26/2011   '74- Rt. lower leg   Headache    History of kidney stones    History of nuclear stress test 12/26/2011   '90-[facility]- normal test   Hypercholesterolemia 12/26/2011   tx. med   Hypertension    Pneumonia    Urinary frequency 12/26/2011   hx.    Past Surgical History:  Procedure Laterality Date   APPENDECTOMY     CHOLECYSTECTOMY  12-26-11   open   ECTOPIC PREGNANCY SURGERY  12-26-11    '81- with tube removed   FOOT SURGERY Left 12/26/2011   '86- bone spurs   HERNIA REPAIR  2015   right lower   JOINT REPLACEMENT  12-26-11   '01/rTKA/'04 LTKA   KIDNEY SURGERY Right    partial kidney removed   KNEE ARTHROSCOPY  12-26-11   bilateral x2 each side, then replacements done   SHOULDER ARTHROSCOPY WITH OPEN ROTATOR CUFF REPAIR Left 09/22/2017   Procedure: SHOULDER ARTHROSCOPY WITH LYSIS AND RESECTION OF  ADHESIONS, AND DEBRIDEMENT;  Surgeon: Juanell Fairly, MD;  Location: [facility] ORS;  Service: Orthopedics;  Laterality: Left;   SHOULDER ARTHROSCOPY WITH ROTATOR CUFF REPAIR Left 11/02/2018   Procedure: SHOULDER ARTHROSCOPY debridement subacromial decompression distal clavicle resection, removal of loose bodies;  Surgeon: Eugenia Mcalpine, MD;  Location: [facility];  Service: Orthopedics;  Laterality: Left;  with interscalene block   stomach ulcer  12-26-11   stress ulcer '70 related to gallbladder surgery   TONSILLECTOMY  12-26-11   '72   TUBAL LIGATION      Allergies: Compazine [prochlorperazine edisylate] and Tramadol  Medications: Prior to Admission medications   Medication Sig Start Date End Date Taking? Authorizing Provider  amLODipine (NORVASC) 5 MG tablet Take 5 mg by mouth daily. 06/22/17   [provider]  cetirizine (ZYRTEC) 10 MG tablet Take 10 mg by mouth daily.    [provider]  fluticasone (FLONASE) 50 MCG/ACT nasal spray Place 2 sprays into both nostrils daily.    [provider]  furosemide (LASIX) 40 MG tablet Take 40 mg by mouth daily.     [provider]  glipiZIDE (GLUCOTROL XL) 5 MG 24 hr tablet Take 5 mg by mouth 2 (two) times daily. Patient not taking: Reported on 04/11/2021    [provider]  glipiZIDE (GLUCOTROL) 10 MG tablet Take 10 mg by mouth 2 (two) times daily. Morning and evening  [provider]  ibuprofen (ADVIL,MOTRIN) 200 MG tablet Take 600 mg by mouth every 6 (six) hours as needed for moderate pain.    [provider]  lisinopril (PRINIVIL,ZESTRIL) 20 MG tablet Take 60 mg by mouth daily.    [provider]  meloxicam (MOBIC) 15 MG tablet Take 15 mg by mouth daily.    [provider]  metFORMIN (GLUCOPHAGE) 1000 MG tablet Take 1,000 mg by mouth 2 (two) times daily with a meal.    [provider]  Polyethyl Glycol-Propyl Glycol (SYSTANE OP) Place 1 drop  into both eyes daily as needed (dry eyes).    [provider]  potassium chloride SA (K-DUR,KLOR-CON) 20 MEQ tablet Take 10 mEq by mouth daily.     [provider]  pravastatin (PRAVACHOL) 20 MG tablet Take 20 mg by mouth at bedtime. Patient not taking: Reported on 04/11/2021    [provider]  rosuvastatin (CRESTOR) 20 MG tablet Take 20 mg by mouth daily.    [provider]     No family history on file.  Social History   Socioeconomic History   Marital status: Married    Spouse name: Not on file   Number of children: Not on file   Years of education: Not on file   Highest education level: Not on file  Occupational History   Not on file  Tobacco Use   Smoking status: Former    Packs/day: 1.5    Types: Cigarettes    Quit date: 12/25/1985    Years since quitting: 36.7   Smokeless tobacco: Never  Vaping Use   Vaping Use: Never used  Substance and Sexual Activity   Alcohol use: No   Drug use: No   Sexual activity: Not on file  Other Topics Concern   Not on file  Social History Narrative   Not on file   Social Determinants of Health   Financial Resource Strain: Not on file  Food Insecurity: Not on file  Transportation Needs: Not on file  Physical Activity: Not on file  Stress: Not on file  Social Connections: Not on file    Review of Systems: A 12 point ROS discussed and pertinent positives are indicated in the HPI above.  All other systems are negative.  Review of Systems  Constitutional:  Negative for appetite change and fatigue.  Respiratory:  Negative for cough and shortness of breath.   Cardiovascular:  Positive for leg swelling. Negative for chest pain.  Gastrointestinal:  Negative for abdominal pain, diarrhea, nausea and vomiting.  Genitourinary:  Negative for difficulty urinating and flank pain.  Musculoskeletal:  Negative for back pain.  Neurological:  Negative for dizziness and headaches.  Psychiatric/Behavioral:  The  patient is nervous/anxious.     Vital Signs: BP (!) 186/101 Comment: Reports being nervous, requested manual blood pressure be taken, will recheck  Ht 5\' 8"  (1.727 m)   Wt 278 lb (126.1 kg)   BMI 42.27 kg/m   Physical Exam Constitutional:      General: She is not in acute distress.    Appearance: She is obese. She is not ill-appearing.  HENT:     Mouth/Throat:     Mouth: Mucous membranes are moist.     Pharynx: Oropharynx is clear.  Cardiovascular:     Rate and Rhythm: Normal rate and regular rhythm.     Pulses: Normal pulses.     Heart sounds: Normal heart sounds.  Pulmonary:     Effort: Pulmonary  effort is normal.     Breath sounds: Normal breath sounds.  Abdominal:     General: Bowel sounds are normal.     Palpations: Abdomen is soft.     Tenderness: There is no abdominal tenderness.  Musculoskeletal:     Right lower leg: Edema present.     Left lower leg: Edema present.  Skin:    General: Skin is warm and dry.  Neurological:     Mental Status: She is alert and oriented to person, place, and time.  Psychiatric:        Mood and Affect: Mood is anxious.        Behavior: Behavior normal.        Thought Content: Thought content normal.        Judgment: Judgment normal.     Imaging: No results found.  Labs:  CBC: Recent Labs    08/29/22 0703  WBC 9.8  HGB 15.5*  HCT 47.5*  PLT 240    COAGS: No results for input(s): "INR", "APTT" in the last 8760 hours.  BMP: No results for input(s): "NA", "K", "CL", "CO2", "GLUCOSE", "BUN", "CALCIUM", "CREATININE", "GFRNONAA", "GFRAA" in the last 8760 hours.  Invalid input(s): "CMP"  LIVER FUNCTION TESTS: No results for input(s): "BILITOT", "AST", "ALT", "ALKPHOS", "PROT", "ALBUMIN" in the last 8760 hours.  TUMOR MARKERS: No results for input(s): "AFPTM", "CEA", "CA199", "CHROMGRNA" in the last 8760 hours.  Assessment and Plan:  Proteinuria; microalbuminuria: Cletis A. Dipasquale, 72 year old female, presents  today to the [facility] Interventional Radiology department for an image-guided  non-focal renal biopsy.  Risks and benefits of this procedure were discussed with the patient and/or patient's family including, but not limited to bleeding, infection, damage to adjacent structures or low yield requiring additional tests.  All of the questions were answered and there is agreement to proceed. She has been NPO. She does not take any blood-thinning medications. She is a full code.   She is hypertensive this morning despite having taken her home blood pressure medicines. IV hydralazine ordered.   Consent signed and in chart.  Thank you for this interesting consult.  I greatly enjoyed meeting Amaria Zervas Dowlen and look forward to participating in their care.  A copy of this report was sent to the requesting provider on this date.  Electronically Signed: Alwyn Ren, AGACNP-BC [phone] 08/29/2022, 7:42 AM   I spent a total of  30 Minutes   in face to face in clinical consultation, greater than 50% of which was counseling/coordinating care for non-focal renal biopsy

## 2022-08-28 ENCOUNTER — Other Ambulatory Visit: Payer: Self-pay | Admitting: Radiology

## 2022-08-28 DIAGNOSIS — N189 Chronic kidney disease, unspecified: Secondary | ICD-10-CM

## 2022-08-29 ENCOUNTER — Ambulatory Visit (HOSPITAL_COMMUNITY)
Admission: RE | Admit: 2022-08-29 | Discharge: 2022-08-29 | Disposition: A | Discharge status: Home / Self Care | Payer: Medicare PPO | Source: Ambulatory Visit | Attending: Nephrology | Admitting: Nephrology

## 2022-08-29 DIAGNOSIS — Z905 Acquired absence of kidney: Secondary | ICD-10-CM | POA: Diagnosis not present

## 2022-08-29 DIAGNOSIS — I129 Hypertensive chronic kidney disease with stage 1 through stage 4 chronic kidney disease, or unspecified chronic kidney disease: Secondary | ICD-10-CM | POA: Diagnosis present

## 2022-08-29 DIAGNOSIS — Z7984 Long term (current) use of oral hypoglycemic drugs: Secondary | ICD-10-CM | POA: Insufficient documentation

## 2022-08-29 DIAGNOSIS — E1122 Type 2 diabetes mellitus with diabetic chronic kidney disease: Secondary | ICD-10-CM | POA: Diagnosis present

## 2022-08-29 DIAGNOSIS — Z85528 Personal history of other malignant neoplasm of kidney: Secondary | ICD-10-CM | POA: Diagnosis not present

## 2022-08-29 DIAGNOSIS — N189 Chronic kidney disease, unspecified: Secondary | ICD-10-CM | POA: Insufficient documentation

## 2022-08-29 DIAGNOSIS — N281 Cyst of kidney, acquired: Secondary | ICD-10-CM

## 2022-08-29 DIAGNOSIS — R808 Other proteinuria: Secondary | ICD-10-CM

## 2022-08-29 DIAGNOSIS — Z87891 Personal history of nicotine dependence: Secondary | ICD-10-CM | POA: Insufficient documentation

## 2022-08-29 DIAGNOSIS — Z9049 Acquired absence of other specified parts of digestive tract: Secondary | ICD-10-CM | POA: Insufficient documentation

## 2022-08-29 DIAGNOSIS — N269 Renal sclerosis, unspecified: Secondary | ICD-10-CM

## 2022-08-29 LAB — CBC
HCT: 47.5 % — ABNORMAL HIGH (ref 36.0–46.0)
Hemoglobin: 15.5 g/dL — ABNORMAL HIGH (ref 12.0–15.0)
MCH: 30.1 pg (ref 26.0–34.0)
MCHC: 32.6 g/dL (ref 30.0–36.0)
MCV: 92.2 fL (ref 80.0–100.0)
Platelets: 240 10*3/uL (ref 150–400)
RBC: 5.15 MIL/uL — ABNORMAL HIGH (ref 3.87–5.11)
RDW: 14.1 % (ref 11.5–15.5)
WBC: 9.8 10*3/uL (ref 4.0–10.5)
nRBC: 0 % (ref 0.0–0.2)

## 2022-08-29 LAB — GLUCOSE, CAPILLARY
Glucose-Capillary: 148 mg/dL — ABNORMAL HIGH (ref 70–99)
Glucose-Capillary: 172 mg/dL — ABNORMAL HIGH (ref 70–99)

## 2022-08-29 LAB — PROTIME-INR
INR: 0.9 (ref 0.8–1.2)
Prothrombin Time: 12.3 seconds (ref 11.4–15.2)

## 2022-08-29 MED ORDER — HYDRALAZINE HCL 20 MG/ML IJ SOLN
10.0000 mg | Freq: Once | INTRAMUSCULAR | Status: AC
  Administered 2022-08-29: 10 mg via INTRAVENOUS

## 2022-08-29 MED ORDER — SODIUM CHLORIDE 0.9 % IV SOLN: INTRAVENOUS | Status: DC

## 2022-08-29 MED ORDER — FENTANYL CITRATE (PF) 100 MCG/2ML IJ SOLN
INTRAMUSCULAR | Status: AC
  Filled 2022-08-29: qty 2

## 2022-08-29 MED ORDER — MIDAZOLAM HCL 2 MG/2ML IJ SOLN
INTRAMUSCULAR | Status: AC
  Filled 2022-08-29: qty 2

## 2022-08-29 MED ORDER — HYDRALAZINE HCL 20 MG/ML IJ SOLN
INTRAMUSCULAR | Status: AC
  Filled 2022-08-29: qty 1

## 2022-08-29 MED ORDER — FENTANYL CITRATE (PF) 100 MCG/2ML IJ SOLN
INTRAMUSCULAR | Status: AC | PRN
  Administered 2022-08-29 (×3): 50 ug via INTRAVENOUS

## 2022-08-29 MED ORDER — LIDOCAINE HCL (PF) 1 % IJ SOLN
5.0000 mL | Freq: Once | INTRAMUSCULAR | Status: AC
  Administered 2022-08-29: 5 mL via INTRADERMAL

## 2022-08-29 MED ORDER — MIDAZOLAM HCL 2 MG/2ML IJ SOLN
INTRAMUSCULAR | Status: AC | PRN
  Administered 2022-08-29 (×3): 1 mg via INTRAVENOUS

## 2022-08-29 MED ORDER — HYDRALAZINE HCL 20 MG/ML IJ SOLN
INTRAMUSCULAR | Status: AC | PRN
  Administered 2022-08-29: 10 mg via INTRAVENOUS

## 2022-08-29 NOTE — Procedures (Signed)
Interventional Radiology Procedure Note  Procedure: US Guided Biopsy of left kidney  Complications: None  Estimated Blood Loss: < 10 mL  Findings: 18 G core biopsy of left kidney performed under US guidance.  Two core samples obtained and sent to Pathology.  Jodi Marble. Fredia Sorrow, M.D Pager:  [phone]

## 2022-09-04 ENCOUNTER — Encounter (HOSPITAL_COMMUNITY): Payer: Self-pay

## 2022-09-05 LAB — SURGICAL PATHOLOGY

## 2022-12-26 ENCOUNTER — Other Ambulatory Visit: Payer: Self-pay

## 2022-12-26 ENCOUNTER — Ambulatory Visit
Admission: RE | Admit: 2022-12-26 | Discharge: 2022-12-26 | Disposition: A | Discharge status: Home / Self Care | Payer: Medicare PPO | Attending: Gastroenterology | Admitting: Gastroenterology

## 2022-12-26 ENCOUNTER — Encounter: Payer: Self-pay | Admitting: *Deleted

## 2022-12-26 ENCOUNTER — Encounter
Admission: RE | Disposition: A | Discharge status: Home / Self Care | Payer: Self-pay | Source: Home / Self Care | Attending: Gastroenterology

## 2022-12-26 ENCOUNTER — Ambulatory Visit: Payer: Medicare PPO | Admitting: Anesthesiology

## 2022-12-26 DIAGNOSIS — Z6841 Body Mass Index (BMI) 40.0 and over, adult: Secondary | ICD-10-CM | POA: Insufficient documentation

## 2022-12-26 DIAGNOSIS — Z85528 Personal history of other malignant neoplasm of kidney: Secondary | ICD-10-CM | POA: Diagnosis not present

## 2022-12-26 DIAGNOSIS — Z86718 Personal history of other venous thrombosis and embolism: Secondary | ICD-10-CM | POA: Insufficient documentation

## 2022-12-26 DIAGNOSIS — I10 Essential (primary) hypertension: Secondary | ICD-10-CM | POA: Insufficient documentation

## 2022-12-26 DIAGNOSIS — K5730 Diverticulosis of large intestine without perforation or abscess without bleeding: Secondary | ICD-10-CM | POA: Diagnosis not present

## 2022-12-26 DIAGNOSIS — Z7984 Long term (current) use of oral hypoglycemic drugs: Secondary | ICD-10-CM | POA: Diagnosis not present

## 2022-12-26 DIAGNOSIS — D123 Benign neoplasm of transverse colon: Secondary | ICD-10-CM | POA: Insufficient documentation

## 2022-12-26 DIAGNOSIS — D122 Benign neoplasm of ascending colon: Secondary | ICD-10-CM | POA: Insufficient documentation

## 2022-12-26 DIAGNOSIS — R195 Other fecal abnormalities: Secondary | ICD-10-CM | POA: Diagnosis not present

## 2022-12-26 DIAGNOSIS — Z1211 Encounter for screening for malignant neoplasm of colon: Secondary | ICD-10-CM | POA: Diagnosis present

## 2022-12-26 DIAGNOSIS — E119 Type 2 diabetes mellitus without complications: Secondary | ICD-10-CM | POA: Insufficient documentation

## 2022-12-26 HISTORY — PX: COLONOSCOPY WITH PROPOFOL: SHX5780

## 2022-12-26 HISTORY — PX: POLYPECTOMY: SHX5525

## 2022-12-26 SURGERY — COLONOSCOPY WITH PROPOFOL
Anesthesia: General

## 2022-12-26 MED ORDER — PROPOFOL 10 MG/ML IV BOLUS
INTRAVENOUS | Status: DC | PRN
Start: 2022-12-26 — End: 2022-12-26
  Administered 2022-12-26: 100 mg via INTRAVENOUS
  Administered 2022-12-26: 150 ug/kg/min via INTRAVENOUS

## 2022-12-26 MED ORDER — SODIUM CHLORIDE 0.9 % IV SOLN: INTRAVENOUS | Status: DC

## 2022-12-26 NOTE — H&P (Signed)
Outpatient short stay form Pre-procedure 12/26/2022  Regis Bill, MD  Primary Physician: Smith Robert, MD  Reason for visit:  Positive FIT test  History of present illness:    72 y/o lady with history of hypertension, obesity, DM II, and HLD here for colonoscopy due to positive FIT test. Had a colonoscopy 15 years ago for diarrhea and was diagnosed with microscopic colitis. No issues with diarrhea now. No blood thinners. History of cholecystectomy, surgery for ruptured ectopic pregnancy, and tubal ligation. No family history of GI malignancies.    Current Facility-Administered Medications:    0.9 %  sodium chloride infusion, , Intravenous, Continuous, Vernona Peake, Rossie Muskrat, MD, Last Rate: 20 mL/hr at 12/26/22 1123, New Bag at 12/26/22 1123  Medications Prior to Admission  Medication Sig Dispense Refill Last Dose   amLODipine (NORVASC) 5 MG tablet Take 5 mg by mouth daily.  0 12/26/2022 at 0615   cetirizine (ZYRTEC) 10 MG tablet Take 10 mg by mouth daily.   12/25/2022   fluticasone (FLONASE) 50 MCG/ACT nasal spray Place 2 sprays into both nostrils daily.   12/25/2022   furosemide (LASIX) 40 MG tablet Take 40 mg by mouth daily.    12/25/2022   glipiZIDE (GLUCOTROL) 10 MG tablet Take 10 mg by mouth 2 (two) times daily. Morning and evening   12/25/2022   ibuprofen (ADVIL,MOTRIN) 200 MG tablet Take 600 mg by mouth every 6 (six) hours as needed for moderate pain.   Past Week   lisinopril (PRINIVIL,ZESTRIL) 20 MG tablet Take 60 mg by mouth daily.   12/26/2022 at 0615   meloxicam (MOBIC) 15 MG tablet Take 15 mg by mouth daily.   Past Week   metFORMIN (GLUCOPHAGE) 1000 MG tablet Take 1,000 mg by mouth 2 (two) times daily with a meal.   12/25/2022   potassium chloride SA (K-DUR,KLOR-CON) 20 MEQ tablet Take 10 mEq by mouth daily.    12/25/2022   rosuvastatin (CRESTOR) 20 MG tablet Take 20 mg by mouth daily.   12/25/2022   glipiZIDE (GLUCOTROL XL) 5 MG 24 hr tablet Take 5 mg by mouth 2 (two) times  daily. (Patient not taking: Reported on 04/11/2021)      Polyethyl Glycol-Propyl Glycol (SYSTANE OP) Place 1 drop into both eyes daily as needed (dry eyes).      pravastatin (PRAVACHOL) 20 MG tablet Take 20 mg by mouth at bedtime. (Patient not taking: Reported on 04/11/2021)        Allergies  Allergen Reactions   Compazine [Prochlorperazine Edisylate]     Extreme Muscle rigidity   Tramadol Itching     Past Medical History:  Diagnosis Date   Arthritis 12/26/2011   arthritis all joints-bursitis in hip   Bronchitis 12/26/2011   '07- severe case-went home with oxygen for awhile, none now. Occ. bronchitis now-last spring    Cancer [facility])    kidney cancer   Complication of anesthesia    Difficulty waking up   Diabetes mellitus 12/26/2011   oral meds . Diabetic x15 yrs   DVT (deep venous thrombosis) (HCC) 12/26/2011   '74- Rt. lower leg   Headache    History of kidney stones    History of nuclear stress test 12/26/2011   '90-[facility]- normal test   Hypercholesterolemia 12/26/2011   tx. med   Hypertension    Pneumonia    Urinary frequency 12/26/2011   hx.    Review of systems:  Otherwise negative.    Physical Exam  Gen: Alert, oriented. Appears stated  age.  HEENT: PERRLA. Lungs: No respiratory distress CV: RRR Abd: soft, benign, no masses Ext: No edema    Planned procedures: Proceed with colonoscopy. The patient understands the nature of the planned procedure, indications, risks, alternatives and potential complications including but not limited to bleeding, infection, perforation, damage to internal organs and possible oversedation/side effects from anesthesia. The patient agrees and gives consent to proceed.  Please refer to procedure notes for findings, recommendations and patient disposition/instructions.     Regis Bill, MD [facility] Gastroenterology

## 2022-12-26 NOTE — Anesthesia Postprocedure Evaluation (Signed)
Anesthesia Post Note  Patient: Sereniti Houchens Krider  Procedure(s) Performed: COLONOSCOPY WITH PROPOFOL POLYPECTOMY  Patient location during evaluation: Endoscopy Anesthesia Type: General Level of consciousness: awake and alert Pain management: pain level controlled Vital Signs Assessment: post-procedure vital signs reviewed and stable Respiratory status: spontaneous breathing, nonlabored ventilation, respiratory function stable and patient connected to nasal cannula oxygen Cardiovascular status: blood pressure returned to baseline and stable Postop Assessment: no apparent nausea or vomiting Anesthetic complications: no   No notable events documented.   Last Vitals:  Vitals:   12/26/22 1244 12/26/22 1253  BP:  138/76  Pulse: 81 71  Resp: 17 19  Temp:    SpO2: 95% 94%    Last Pain:  Vitals:   12/26/22 1244  TempSrc:   PainSc: 0-No pain                 Louie Boston

## 2022-12-26 NOTE — Anesthesia Preprocedure Evaluation (Addendum)
Anesthesia Evaluation  Patient identified by MRN, date of birth, ID band Patient awake    Reviewed: Allergy & Precautions, NPO status , Patient's Chart, lab work & pertinent test results  History of Anesthesia Complications Negative for: history of anesthetic complications  Airway Mallampati: IV  TM Distance: >3 FB Neck ROM: full    Dental  (+) Partial Upper   Pulmonary former smoker   Pulmonary exam normal        Cardiovascular hypertension, On Medications + DVT (2013)  Normal cardiovascular exam     Neuro/Psych  Headaches  negative psych ROS   GI/Hepatic negative GI ROS, Neg liver ROS,,,  Endo/Other  diabetes, Type 2, Oral Hypoglycemic Agents  Morbid obesity  Renal/GU negative Renal ROS  negative genitourinary   Musculoskeletal  (+) Arthritis ,    Abdominal   Peds  Hematology negative hematology ROS (+)   Anesthesia Other Findings Past Medical History: 12/26/2011: Arthritis     Comment:  arthritis all joints-bursitis in hip 12/26/2011: Bronchitis     Comment:  '07- severe case-went home with oxygen for awhile, none               now. Occ. bronchitis now-last spring  No date: Cancer [facility])     Comment:  kidney cancer No date: Complication of anesthesia     Comment:  Difficulty waking up 12/26/2011: Diabetes mellitus     Comment:  oral meds . Diabetic x15 yrs 12/26/2011: DVT (deep venous thrombosis) (HCC)     Comment:  '74- Rt. lower leg No date: Headache No date: History of kidney stones 12/26/2011: History of nuclear stress test     Comment:  '90-[facility]- normal test 12/26/2011: Hypercholesterolemia     Comment:  tx. med No date: Hypertension No date: Pneumonia 12/26/2011: Urinary frequency     Comment:  hx.  Past Surgical History: No date: APPENDECTOMY 12-26-11: CHOLECYSTECTOMY     Comment:  open 12-26-11 : ECTOPIC PREGNANCY SURGERY     Comment:  '81- with tube removed 12/26/2011: FOOT SURGERY;  Left     Comment:  '86- bone spurs 2015: HERNIA REPAIR     Comment:  right lower 12-26-11: JOINT REPLACEMENT     Comment:  '01/rTKA/'04 LTKA No date: KIDNEY SURGERY; Right     Comment:  partial kidney removed 12-26-11: KNEE ARTHROSCOPY     Comment:  bilateral x2 each side, then replacements done 09/22/2017: SHOULDER ARTHROSCOPY WITH OPEN ROTATOR CUFF REPAIR; Left     Comment:  Procedure: SHOULDER ARTHROSCOPY WITH LYSIS AND RESECTION              OF ADHESIONS, AND DEBRIDEMENT;  Surgeon: Juanell Fairly, MD;  Location: [facility] ORS;  Service: Orthopedics;                Laterality: Left; 11/02/2018: SHOULDER ARTHROSCOPY WITH ROTATOR CUFF REPAIR; Left     Comment:  Procedure: SHOULDER ARTHROSCOPY debridement subacromial               decompression distal clavicle resection, removal of loose              bodies;  Surgeon: Eugenia Mcalpine, MD;  Location: [facility];  Service: Orthopedics;  Laterality:               Left;  with interscalene block 12-26-11: stomach  ulcer     Comment:  stress ulcer '70 related to gallbladder surgery 12-26-11: TONSILLECTOMY     Comment:  '72 No date: TUBAL LIGATION  BMI    Body Mass Index: 40.60 kg/m      Reproductive/Obstetrics negative OB ROS                             Anesthesia Physical Anesthesia Plan  ASA: 3  Anesthesia Plan: General   Post-op Pain Management: Minimal or no pain anticipated   Induction: Intravenous  PONV Risk Score and Plan: 2 and Propofol infusion and TIVA  Airway Management Planned: Natural Airway and Nasal Cannula  Additional Equipment:   Intra-op Plan:   Post-operative Plan:   Informed Consent: I have reviewed the patients History and Physical, chart, labs and discussed the procedure including the risks, benefits and alternatives for the proposed anesthesia with the patient or authorized representative who has indicated his/her understanding and  acceptance.     Dental Advisory Given  Plan Discussed with: Anesthesiologist, CRNA and Surgeon  Anesthesia Plan Comments: (Patient consented for risks of anesthesia including but not limited to:  - adverse reactions to medications - risk of airway placement if required - damage to eyes, teeth, lips or other oral mucosa - nerve damage due to positioning  - sore throat or hoarseness - Damage to heart, brain, nerves, lungs, other parts of body or loss of life  Patient voiced understanding.)       Anesthesia Quick Evaluation

## 2022-12-26 NOTE — Interval H&P Note (Signed)
History and Physical Interval Note:  12/26/2022 11:57 AM  Grace [name]  has presented today for surgery, with the diagnosis of + Cologuard.  The various methods of treatment have been discussed with the patient and family. After consideration of risks, benefits and other options for treatment, the patient has consented to  Procedure(s): COLONOSCOPY WITH PROPOFOL (N/A) as a surgical intervention.  The patient's history has been reviewed, patient examined, no change in status, stable for surgery.  I have reviewed the patient's chart and labs.  Questions were answered to the patient's satisfaction.     Regis Bill  Ok to proceed with colonoscopy

## 2022-12-26 NOTE — Transfer of Care (Signed)
Immediate Anesthesia Transfer of Care Note  Patient: Grace [name]  Procedure(s) Performed: COLONOSCOPY WITH PROPOFOL POLYPECTOMY  Patient Location: PACU and Endoscopy Unit  Anesthesia Type:General  Level of Consciousness: drowsy and patient cooperative  Airway & Oxygen Therapy: Patient Spontanous Breathing  Post-op Assessment: Report given to RN and Post -op Vital signs reviewed and stable  Post vital signs: Reviewed and stable  Last Vitals:  Vitals Value Taken Time  BP 153/74 12/26/22 1234  Temp 35.9 C 12/26/22 1233  Pulse 89 12/26/22 1234  Resp 19 12/26/22 1234  SpO2 93 % 12/26/22 1234  Vitals shown include unfiled device data.  Last Pain:  Vitals:   12/26/22 1233  TempSrc: Temporal  PainSc: Asleep         Complications: No notable events documented.

## 2022-12-26 NOTE — Op Note (Signed)
[facility] Gastroenterology Patient Name: Grace [name] Procedure Date: 12/26/2022 11:47 AM MRN: [id number] Account #: [id number] Date of Birth: 1950-09-24 Admit Type: Outpatient Age: 72 Room: [facility] ENDO ROOM 3 Gender: Female Note Status: Finalized Instrument Name: Prentice Docker [id number] Procedure:             Colonoscopy Indications:           Positive fecal immunochemical test Providers:             Eather Colas MD, MD Referring MD:          Eather Colas MD, MD (Referring MD) Medicines:             Monitored Anesthesia Care Complications:         No immediate complications. Estimated blood loss:                         Minimal. Procedure:             Pre-Anesthesia Assessment:                        - Prior to the procedure, a History and Physical was                         performed, and patient medications and allergies were                         reviewed. The patient is competent. The risks and                         benefits of the procedure and the sedation options and                         risks were discussed with the patient. All questions                         were answered and informed consent was obtained.                         Patient identification and proposed procedure were                         verified by the physician, the nurse, the                         anesthesiologist, the anesthetist and the technician                         in the endoscopy suite. Mental Status Examination:                         alert and oriented. Airway Examination: normal                         oropharyngeal airway and neck mobility. Respiratory                         Examination: clear to auscultation. CV Examination:  normal. Prophylactic Antibiotics: The patient does not                         require prophylactic antibiotics. Prior                         Anticoagulants: The patient has taken no anticoagulant                          or antiplatelet agents. ASA Grade Assessment: III - A                         patient with severe systemic disease. After reviewing                         the risks and benefits, the patient was deemed in                         satisfactory condition to undergo the procedure. The                         anesthesia plan was to use monitored anesthesia care                         (MAC). Immediately prior to administration of                         medications, the patient was re-assessed for adequacy                         to receive sedatives. The heart rate, respiratory                         rate, oxygen saturations, blood pressure, adequacy of                         pulmonary ventilation, and response to care were                         monitored throughout the procedure. The physical                         status of the patient was re-assessed after the                         procedure.                        After obtaining informed consent, the colonoscope was                         passed under direct vision. Throughout the procedure,                         the patient's blood pressure, pulse, and oxygen                         saturations were monitored continuously. The  Colonoscope was introduced through the anus and                         advanced to the the cecum, identified by appendiceal                         orifice and ileocecal valve. The colonoscopy was                         somewhat difficult due to restricted mobility of the                         colon. The patient tolerated the procedure well. The                         quality of the bowel preparation was good. The                         ileocecal valve, appendiceal orifice, and rectum were                         photographed. Findings:      The perianal and digital rectal examinations were normal.      A 3 mm polyp was found in the ascending colon. The  polyp was sessile.       The polyp was removed with a cold snare. Resection and retrieval were       complete. Estimated blood loss was minimal.      A 5 mm polyp was found in the proximal transverse colon. The polyp was       sessile. The polyp was removed with a cold snare. Resection and       retrieval were complete. Estimated blood loss was minimal.      A few small-mouthed diverticula were found in the transverse colon.      A few small-mouthed diverticula were found in the sigmoid colon.      The exam was otherwise without abnormality on direct and retroflexion       views. Impression:            - One 3 mm polyp in the ascending colon, removed with                         a cold snare. Resected and retrieved.                        - One 5 mm polyp in the proximal transverse colon,                         removed with a cold snare. Resected and retrieved.                        - Diverticulosis in the transverse colon.                        - Diverticulosis in the sigmoid colon.                        - The examination was otherwise normal on direct and  retroflexion views. Recommendation:        - Discharge patient to home.                        - Resume previous diet.                        - Continue present medications.                        - Await pathology results.                        - Repeat colonoscopy in 7 years for surveillance.                        - Return to referring physician as previously                         scheduled. Procedure Code(s):     --- Professional ---                        [phone], Colonoscopy, flexible; with removal of                         tumor(s), polyp(s), or other lesion(s) by snare                         technique Diagnosis Code(s):     --- Professional ---                        D12.2, Benign neoplasm of ascending colon                        D12.3, Benign neoplasm of transverse colon (hepatic                          flexure or splenic flexure)                        R19.5, Other fecal abnormalities                        K57.30, Diverticulosis of large intestine without                         perforation or abscess without bleeding CPT copyright 2022 [employer]. All rights reserved. The codes documented in this report are preliminary and upon coder review may  be revised to meet current compliance requirements. Eather Colas MD, MD 12/26/2022 12:36:02 PM Number of Addenda: 0 Note Initiated On: 12/26/2022 11:47 AM Scope Withdrawal Time: 0 hours 11 minutes 42 seconds  Total Procedure Duration: 0 hours 22 minutes 51 seconds  Estimated Blood Loss:  Estimated blood loss was minimal.      [facility]

## 2022-12-29 ENCOUNTER — Encounter: Payer: Self-pay | Admitting: Gastroenterology

## 2023-01-01 LAB — SURGICAL PATHOLOGY
